# Patient Record
Sex: Female | Born: 1995 | Race: White | Hispanic: No | Smoking: Former smoker
Health system: Southern US, Community
[De-identification: ages and names within clinical notes are randomized; demographics above are authoritative.]

## PROBLEM LIST (undated history)

## (undated) DIAGNOSIS — L709 Acne, unspecified: Secondary | ICD-10-CM

## (undated) HISTORY — DX: Acne, unspecified: L70.9

---
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
## 2020-05-23 NOTE — L&D Delivery Note (Signed)
DELIVERY NOTE  Pt complete and at +2 station with urge to push. Epidural controlling pain only moderately. Pt pushed and delivered a viable female infant in LOA position; long nuchal cord with triple nuchal, able to be reduced at perineum. Body cord then noted, delivered through. Anterior and posterior shoulders spontaneously delivered with next two pushes; body easily followed next. Infant placed on mothers abdomen and bulb suction of mouth and nose performed. Cord was then clamped and cut by FOB. Cord blood obtained, 3VC. Baby had a vigorous spontaneous cry noted. Placenta then delivered at 0919 intact. Fundal massage performed and pitocin per protocol. Fundus firm. The following lacerations were noted: BL labial abrasions, perineal abrasion. Initially oozing but became hemostatic with pressure, no suture required. EBL 350cc.  Mother and baby stable. Counts correct   Infant time: 0915 Gender: female Placenta time: 0919 Apgars: 9/9 Weight: pending skin-to-skin

## 2020-09-23 ENCOUNTER — Encounter: Payer: Self-pay | Admitting: Family Medicine

## 2020-09-23 ENCOUNTER — Ambulatory Visit (INDEPENDENT_AMBULATORY_CARE_PROVIDER_SITE_OTHER): Payer: BC Managed Care – PPO | Admitting: Family Medicine

## 2020-09-23 ENCOUNTER — Other Ambulatory Visit: Payer: Self-pay

## 2020-09-23 ENCOUNTER — Other Ambulatory Visit (HOSPITAL_COMMUNITY)
Admission: RE | Admit: 2020-09-23 | Discharge: 2020-09-23 | Disposition: A | Discharge status: Home / Self Care | Payer: BC Managed Care – PPO | Source: Ambulatory Visit | Attending: Family Medicine | Admitting: Family Medicine

## 2020-09-23 VITALS — BP 124/77 | HR 69 | Wt 144.0 lb

## 2020-09-23 DIAGNOSIS — Z3401 Encounter for supervision of normal first pregnancy, first trimester: Secondary | ICD-10-CM | POA: Insufficient documentation

## 2020-09-23 DIAGNOSIS — Z3A01 Less than 8 weeks gestation of pregnancy: Secondary | ICD-10-CM | POA: Diagnosis not present

## 2020-09-23 NOTE — Progress Notes (Signed)
  Patient had pap smear at [facility] in Feb 2022.   DATING AND VIABILITY SONOGRAM   Stuti Sandin is a 25 y.o. year old G1P0 with LMP Patient's last menstrual period was 07/30/2020. which would correlate to  [redacted]w[redacted]d weeks gestation.  She has regular menstrual cycles.   She is here today for a confirmatory initial sonogram.    GESTATION: SINGLETON     FETAL ACTIVITY:          Heart rate         175          The fetus is active.       GESTATIONAL AGE AND  BIOMETRICS:  Gestational criteria: Estimated Date of Delivery: 05/12/21 by early ultrasound now at [redacted]w[redacted]d  Previous Scans:0  GESTATIONAL SAC           cm        6-6 weeks  CROWN RUMP LENGTH           0.90 cm         7-0 weeks         0.94 cm         7-0 weeks                                                                           AVERAGE EGA(BY THIS SCAN):  7-0 weeks  WORKING EDD( early ultrasound ):  05-12-2021     TECHNICIAN COMMENTS: Patient informed that the ultrasound is considered a limited obstetric ultrasound and is not intended to be a complete ultrasound exam. Patient also informed that the ultrasound is not being completed with the intent of assessing for fetal or placental anomalies or any pelvic abnormalities. Explained that the purpose of today's ultrasound is to assess for fetal heart rate. Patient acknowledges the purpose of the exam and the limitations of the study.     Armandina Stammer 09/23/2020 11:08 AM

## 2020-09-23 NOTE — Addendum Note (Signed)
Addended by: Anell Barr on: 09/23/2020 01:31 PM   Modules accepted: Orders

## 2020-09-23 NOTE — Addendum Note (Signed)
Addended by: Anell Barr on: 09/23/2020 01:36 PM   Modules accepted: Orders

## 2020-09-23 NOTE — Progress Notes (Addendum)
Subjective:  Stacey Anderson is a G1P0 [redacted]w[redacted]d by first trimester [place], being seen today for her first obstetrical visit.  Her obstetrical history is significant for first pregnancy. Patient does not intend to breast feed. Pregnancy history fully reviewed.  Patient reports nausea.  BP 124/77   Pulse 69   Wt 144 lb (65.3 kg)   LMP 07/30/2020   HISTORY: OB History  Gravida Para Term Preterm AB Living  1            SAB IAB Ectopic Multiple Live Births               # Outcome Date GA Lbr Len/2nd Weight Sex Delivery Anes PTL Lv  1 Current             Past Medical History:  Diagnosis Date  . Acne     History reviewed. No pertinent surgical history.  Family History  Problem Relation Age of Onset  . Cancer Mother   . Heart disease Mother   . Diabetes Neg Hx   . Hypertension Neg Hx   . Stroke Neg Hx      Exam  BP 124/77   Pulse 69   Wt 144 lb (65.3 kg)   LMP 07/30/2020   Chaperone present during exam  CONSTITUTIONAL: Well-developed, well-nourished female in no acute distress.  HENT:  Normocephalic, atraumatic, External right and left ear normal. Oropharynx is clear and moist EYES: Conjunctivae and EOM are normal. Pupils are equal, round, and reactive to light. No scleral icterus.  NECK: Normal range of motion, supple, no masses.  Normal thyroid.  CARDIOVASCULAR: Normal heart rate noted, regular rhythm RESPIRATORY: Clear to auscultation bilaterally. Effort and breath sounds normal, no problems with respiration noted. BREASTS: Symmetric in size. No masses, skin changes, nipple drainage, or lymphadenopathy. ABDOMEN: Soft, normal bowel sounds, no distention noted.  No tenderness, rebound or guarding.  PELVIC: Normal appearing external genitalia; normal appearing vaginal mucosa and cervix. No abnormal discharge noted. Normal uterine size, no other palpable masses, no uterine or adnexal tenderness. MUSCULOSKELETAL: Normal range of motion. No tenderness.  No cyanosis, clubbing, or  edema.  2+ distal pulses. SKIN: Skin is warm and dry. No rash noted. Not diaphoretic. No erythema. No pallor. NEUROLOGIC: Alert and oriented to person, place, and time. Normal reflexes, muscle tone coordination. No cranial nerve deficit noted. PSYCHIATRIC: Normal mood and affect. Normal behavior. Normal judgment and thought content.    Assessment:    Pregnancy: G1P0 Patient Active Problem List   Diagnosis Date Noted  . Encounter for supervision of normal first pregnancy in first trimester 09/23/2020  . Left hand pain 08/14/2012      Plan:   1. Encounter for supervision of normal first pregnancy in first trimester Initial labs obtained Continue prenatal vitamins Reviewed n/v relief measures and warning s/s to report Reviewed recommended weight gain based on pre-gravid BMI Encouraged well-balanced diet Genetic & carrier screening discussed: requests Panorama Ultrasound discussed; fetal survey: requested CCNC completed> form faxed if has or is planning to apply for medicaid The nature of [facility] [employer] with multiple MDs and other Advanced Practice Providers was explained to patient; also emphasized that fellows, residents, and students are part of our team. - CBC/D/Plt+RPR+Rh+ABO+Rub Ab... - Urine Culture - GC/Chlamydia probe amp ([place])not at [facility] - [place] MFM OB COMP + 14 WK; Future - Enroll Patient in PreNatal Babyscripts - Babyscripts Schedule Optimization   Problem list reviewed and updated. 75% of 30 min  visit spent on counseling and coordination of care.     Levie Heritage 09/23/2020

## 2020-09-24 LAB — GC/CHLAMYDIA PROBE AMP (~~LOC~~) NOT AT ARMC
Chlamydia: NEGATIVE
Comment: NEGATIVE
Comment: NORMAL
Neisseria Gonorrhea: NEGATIVE

## 2020-09-25 LAB — OBSTETRIC PANEL, INCLUDING HIV
Antibody Screen: NEGATIVE
Basophils Absolute: 0.1 10*3/uL (ref 0.0–0.2)
Basos: 1 %
EOS (ABSOLUTE): 0.5 10*3/uL — ABNORMAL HIGH (ref 0.0–0.4)
Eos: 6 %
HIV Screen 4th Generation wRfx: NONREACTIVE
Hematocrit: 37.2 % (ref 34.0–46.6)
Hemoglobin: 12.6 g/dL (ref 11.1–15.9)
Hepatitis B Surface Ag: NEGATIVE
Immature Grans (Abs): 0 10*3/uL (ref 0.0–0.1)
Immature Granulocytes: 0 %
Lymphocytes Absolute: 2.2 10*3/uL (ref 0.7–3.1)
Lymphs: 24 %
MCH: 31.3 pg (ref 26.6–33.0)
MCHC: 33.9 g/dL (ref 31.5–35.7)
MCV: 92 fL (ref 79–97)
Monocytes Absolute: 0.6 10*3/uL (ref 0.1–0.9)
Monocytes: 6 %
Neutrophils Absolute: 5.8 10*3/uL (ref 1.4–7.0)
Neutrophils: 63 %
Platelets: 253 10*3/uL (ref 150–450)
RBC: 4.03 x10E6/uL (ref 3.77–5.28)
RDW: 12.1 % (ref 11.7–15.4)
RPR Ser Ql: NONREACTIVE
Rh Factor: POSITIVE
Rubella Antibodies, IGG: 18.3 index (ref >0.99)
WBC: 9.2 10*3/uL (ref 3.4–10.8)

## 2020-09-25 LAB — HEPATITIS C ANTIBODY
HCV Ab: NEGATIVE
Hep C Virus Ab: 0.1 s/co ratio (ref 0.0–0.9)

## 2020-09-25 LAB — URINE CULTURE

## 2020-10-20 ENCOUNTER — Encounter: Payer: Self-pay | Admitting: Advanced Practice Midwife

## 2020-10-20 ENCOUNTER — Other Ambulatory Visit: Payer: Self-pay

## 2020-10-20 ENCOUNTER — Ambulatory Visit (INDEPENDENT_AMBULATORY_CARE_PROVIDER_SITE_OTHER): Payer: BC Managed Care – PPO | Admitting: Advanced Practice Midwife

## 2020-10-20 VITALS — BP 123/72 | HR 81 | Wt 149.0 lb

## 2020-10-20 DIAGNOSIS — Z3A10 10 weeks gestation of pregnancy: Secondary | ICD-10-CM

## 2020-10-20 NOTE — Progress Notes (Signed)
   PRENATAL VISIT NOTE  Subjective:  Stacey Anderson is a 25 y.o. G1P0 at [redacted]w[redacted]d being seen today for ongoing prenatal care.  She is currently monitored for the following issues for this low-risk pregnancy and has Left hand pain and Encounter for supervision of normal first pregnancy in first trimester on their problem list.  Patient reports no complaints.  Contractions: Not present. Vag. Bleeding: None.  Movement: Absent. Denies leaking of fluid.   The following portions of the patient's history were reviewed and updated as appropriate: allergies, current medications, past family history, past medical history, past social history, past surgical history and problem list.   Objective:   Vitals:   10/20/20 0933  BP: 123/72  Pulse: 81  Weight: 149 lb (67.6 kg)    Fetal Status: Fetal Heart Rate (bpm): 160   Movement: Absent     General:  Alert, oriented and cooperative. Patient is in no acute distress.  Skin: Skin is warm and dry. No rash noted.   Cardiovascular: Normal heart rate noted  Respiratory: Normal respiratory effort, no problems with respiration noted  Abdomen: Soft, gravid, appropriate for gestational age.  Pain/Pressure: Absent     Pelvic: Cervical exam deferred        Extremities: Normal range of motion.  Edema: None  Mental Status: Normal mood and affect. Normal behavior. Normal judgment and thought content.   Assessment and Plan:  Pregnancy: G1P0 at [redacted]w[redacted]d 1. [redacted] weeks gestation of pregnancy     Panorama done today, plan AFP next month     Anatomy US at 20 wks - Genetic Screening  Preterm labor symptoms and general obstetric precautions including but not limited to vaginal bleeding, contractions, leaking of fluid and fetal movement were reviewed in detail with the patient. Please refer to After Visit Summary for other counseling recommendations.   Return in about 5 weeks (around 11/24/2020), or AFP next visit.  Future Appointments  Date Time Provider Department Center   11/17/2020  8:15 AM Aviva Signs, CNM [facility] None  12/15/2020  8:15 AM Aviva Signs, CNM [facility] None  12/16/2020 10:45 AM [facility] US5 [facility]    Wynelle Bourgeois, CNM

## 2020-10-20 NOTE — Patient Instructions (Signed)
Obstetrics: Normal and Problem Pregnancies (7th ed., pp. 102-121). [place], [place]: Elsevier."> Textbook of Family Medicine (9th ed., pp. [phone]). [place], [place]: Elsevier [employer].">  First Trimester of Pregnancy  The first trimester of pregnancy starts on the first day of your last menstrual period until the end of week 12. This is months 1 through 3 of pregnancy. A week after a sperm fertilizes an egg, the egg will implant into the wall of the uterus and begin to develop into a baby. By the end of 12 weeks, all the baby's organs will be formed and the baby will be 2-3 inches in size. Body changes during your first trimester Your body goes through many changes during pregnancy. The changes vary and generally return to normal after your baby is born. Physical changes  You may gain or lose weight.  Your breasts may begin to grow larger and become tender. The tissue that surrounds your nipples (areola) may become darker.  Dark spots or blotches (chloasma or mask of pregnancy) may develop on your face.  You may have changes in your hair. These can include thickening or thinning of your hair or changes in texture. Health changes  You may feel nauseous, and you may vomit.  You may have heartburn.  You may develop headaches.  You may develop constipation.  Your gums may bleed and may be sensitive to brushing and flossing. Other changes  You may tire easily.  You may urinate more often.  Your menstrual periods will stop.  You may have a loss of appetite.  You may develop cravings for certain kinds of food.  You may have changes in your emotions from day to day.  You may have more vivid and strange dreams. Follow these instructions at home: Medicines  Follow your health care provider's instructions regarding medicine use. Specific medicines may be either safe or unsafe to take during pregnancy. Do not take any medicines unless told to by your health care provider.  Take a  prenatal vitamin that contains at least 600 micrograms (mcg) of folic acid. Eating and drinking  Eat a healthy diet that includes fresh fruits and vegetables, whole grains, good sources of protein such as meat, eggs, or tofu, and low-fat dairy products.  Avoid raw meat and unpasteurized juice, milk, and cheese. These carry germs that can harm you and your baby.  If you feel nauseous or you vomit: ? Eat 4 or 5 small meals a day instead of 3 large meals. ? Try eating a few soda crackers. ? Drink liquids between meals instead of during meals.  You may need to take these actions to prevent or treat constipation: ? Drink enough fluid to keep your urine pale yellow. ? Eat foods that are high in fiber, such as beans, whole grains, and fresh fruits and vegetables. ? Limit foods that are high in fat and processed sugars, such as fried or sweet foods. Activity  Exercise only as directed by your health care provider. Most people can continue their usual exercise routine during pregnancy. Try to exercise for 30 minutes at least 5 days a week.  Stop exercising if you develop pain or cramping in the lower abdomen or lower back.  Avoid exercising if it is very hot or humid or if you are at high altitude.  Avoid heavy lifting.  If you choose to, you may have sex unless your health care provider tells you not to. Relieving pain and discomfort  Wear a good support bra to relieve breast  tenderness.  Rest with your legs elevated if you have leg cramps or low back pain.  If you develop bulging veins (varicose veins) in your legs: ? Wear support hose as told by your health care provider. ? Elevate your feet for 15 minutes, 3-4 times a day. ? Limit salt in your diet. Safety  Wear your seat belt at all times when driving or riding in a car.  Talk with your health care provider if someone is verbally or physically abusive to you.  Talk with your health care provider if you are feeling sad or have  thoughts of hurting yourself. Lifestyle  Do not use hot tubs, steam rooms, or saunas.  Do not douche. Do not use tampons or scented sanitary pads.  Do not use herbal remedies, alcohol, illegal drugs, or medicines that are not approved by your health care provider. Chemicals in these products can harm your baby.  Do not use any products that contain nicotine or tobacco, such as cigarettes, e-cigarettes, and chewing tobacco. If you need help quitting, ask your health care provider.  Avoid cat litter boxes and soil used by cats. These carry germs that can cause birth defects in the baby and possibly loss of the unborn baby (fetus) by miscarriage or stillbirth. General instructions  During routine prenatal visits in the first trimester, your health care provider will do a physical exam, perform necessary tests, and ask you how things are going. Keep all follow-up visits. This is important.  Ask for help if you have counseling or nutritional needs during pregnancy. Your health care provider can offer advice or refer you to specialists for help with various needs.  Schedule a dentist appointment. At home, brush your teeth with a soft toothbrush. Floss gently.  Write down your questions. Take them to your prenatal visits. Where to find more information  American Pregnancy Association: americanpregnancy.org  [employer] of Obstetricians and Gynecologists: https://www.todd-brady.net/  Office on [employer] Health: MightyReward.co.nz Contact a health care provider if you have:  Dizziness.  A fever.  Mild pelvic cramps, pelvic pressure, or nagging pain in the abdominal area.  Nausea, vomiting, or diarrhea that lasts for 24 hours or longer.  A bad-smelling vaginal discharge.  Pain when you urinate.  Known exposure to a contagious illness, such as chickenpox, measles, Zika virus, HIV, or hepatitis. Get help right away if you have:  Spotting or bleeding from your  vagina.  Severe abdominal cramping or pain.  Shortness of breath or chest pain.  Any kind of trauma, such as from a fall or a car crash.  New or increased pain, swelling, or redness in an arm or leg. Summary  The first trimester of pregnancy starts on the first day of your last menstrual period until the end of week 12 (months 1 through 3).  Eating 4 or 5 small meals a day rather than 3 large meals may help to relieve nausea and vomiting.  Do not use any products that contain nicotine or tobacco, such as cigarettes, e-cigarettes, and chewing tobacco. If you need help quitting, ask your health care provider.  Keep all follow-up visits. This is important. This information is not intended to replace advice given to you by your health care provider. Make sure you discuss any questions you have with your health care provider. Document Revised: 10/16/2019 Document Reviewed: 08/22/2019 Elsevier Patient Education  2021 Elsevier Inc. Genetic Testing During Pregnancy Why is genetic testing done? Genetic testing during pregnancy is also called prenatal genetic testing.  This type of testing can determine if your baby is at risk of being born with a disorder caused by abnormal genes or chromosomes (genetic disorder). Chromosomes contain genes that control how your baby will develop in your womb. There are many different genetic disorders. Examples of genetic disorders that may be found through genetic testing include Down syndrome and cystic fibrosis. Gene changes (mutations) can be passed down through families. Genetic testing is offered to women during pregnancy. You can choose whether to have genetic testing. Having genetic testing allows you to:  Discuss your test results and options with your health care provider.  Prepare for a baby that may be born with a genetic disorder. Learning about the disorder ahead of time helps you be better prepared to manage it. Your health care providers can also  be prepared in case your baby requires special care before or after birth.  Consider whether you want to continue with the pregnancy. In some cases, genetic testing may be done to learn about the traits a child will inherit. Types of genetic tests There are two basic types of genetic testing. Screening tests indicate whether your developing baby (fetus) is at higher risk for a genetic disorder. Diagnostic tests check actual fetal cells to diagnose a genetic disorder. Screening tests Screening tests will not harm your baby. They are recommended for all pregnant women. Types of screening tests include:  Carrier screening. This test involves checking genes from both parents by testing their blood or saliva. The test checks to find out if the parents carry a genetic mutation that may be passed to a baby. In most cases, both parents must carry the mutation for a baby to be at risk.  First trimester screening. This test combines a blood test with sound wave imaging of your baby (fetal ultrasound). This screening test checks for a risk of Down syndrome or other defects caused by having extra chromosomes. The ultrasound also checks for defects of the heart, abdomen, or skeleton.  Second trimester screening also combines a blood test with a fetal ultrasound exam. This test checks for a risk of Down syndrome or other defects caused by having extra chromosomes. The ultrasound allows your health care provider to look for genetic defects of the face, brain, spine, heart, or limbs. Some women may choose to only have an ultrasound exam without a blood test.  Combined or sequential screening. This type of testing combines the results of first and second trimester screening. This type of testing may be more accurate than first or second trimester screening alone.  Cell-free DNA testing. This is a blood test that detects cells released by the placenta that get into the mother's blood. It can be used to check for a  risk of Down syndrome, other extra chromosome syndromes, and disorders caused by abnormal numbers of sex chromosomes. This test can be done any time after 10 weeks of pregnancy.      Diagnostic tests Diagnostic tests carry slight risks of problems, including bleeding, infection, and loss of the pregnancy. These tests are done only if your baby is at risk for a genetic disorder. Your health care provider will discuss the risks and benefits of having diagnostic tests before performing these types of tests. Examples of diagnostic tests include:  Chorionic villus sampling (CVS). This involves a procedure to remove and test a sample of cells taken from the placenta. The procedure may be done between 10 and 12 weeks of pregnancy.  Amniocentesis. This involves a  procedure to remove and test a sample of fluid (amniotic fluid) and cells from the sac that surrounds the developing baby. The procedure may be done any time during the pregnancy, but it is usually done between 15 and 20 weeks of pregnancy. What do the results mean? For a screening test:  If the results are negative, it often means that your child is not at higher risk. There is still a slight chance your child could have a genetic disorder.  If the results are positive, it does not mean your child will have a genetic disorder. It may mean that your child has a higher-than-normal risk for a genetic disorder. In that case, you should talk with your health care provider about whether you should have diagnostic genetic tests. For a diagnostic test:  If the result is negative, it is unlikely that your child will have a genetic disorder.  If the test is positive for a genetic disorder, it is likely that your child will have the disorder. The test may not tell how severe the disorder will be. Talk with your health care provider about your options. Talk with your health care provider about what your results mean. Questions to ask your health care  provider Before talking to your health care provider about genetic testing, find out if there is a history of genetic disorders in your family. It may also help to know your family's ethnic origins. Then ask your health care provider the following questions:  Is my baby at risk for a genetic disorder?  What are the benefits of having genetic screening?  What tests are best for me and my baby?  What are the risks of each test?  If I get a positive result on a screening test, what is the next step?  Should I meet with a genetic counselor?  Should my partner or other members of my family be tested?  How much do the tests cost? Will my insurance cover the testing? Summary  Genetic testing is done during pregnancy to find out whether your child is at risk for a genetic disorder.  Genetic testing is offered to women during pregnancy. You can choose whether to have genetic testing.  There are two basic types of genetic testing. Screening tests indicate whether your developing baby (fetus) is at higher risk for a genetic disorder. Diagnostic tests check actual fetal cells to diagnose a genetic disorder.  If a diagnostic genetic test is positive, talk with your health care provider about your options. This information is not intended to replace advice given to you by your health care provider. Make sure you discuss any questions you have with your health care provider. Document Revised: 11/29/2019 Document Reviewed: 11/29/2019 Elsevier Patient Education  2021 [employer].

## 2020-10-27 ENCOUNTER — Encounter: Payer: Self-pay | Admitting: General Practice

## 2020-11-06 ENCOUNTER — Encounter: Payer: Self-pay | Admitting: General Practice

## 2020-11-17 ENCOUNTER — Other Ambulatory Visit: Payer: Self-pay

## 2020-11-17 ENCOUNTER — Ambulatory Visit (INDEPENDENT_AMBULATORY_CARE_PROVIDER_SITE_OTHER): Payer: BC Managed Care – PPO | Admitting: Advanced Practice Midwife

## 2020-11-17 ENCOUNTER — Encounter: Payer: Self-pay | Admitting: Advanced Practice Midwife

## 2020-11-17 VITALS — BP 127/70 | HR 64 | Wt 151.0 lb

## 2020-11-17 DIAGNOSIS — Z3A14 14 weeks gestation of pregnancy: Secondary | ICD-10-CM

## 2020-11-17 DIAGNOSIS — Z3401 Encounter for supervision of normal first pregnancy, first trimester: Secondary | ICD-10-CM

## 2020-11-17 NOTE — Progress Notes (Signed)
   PRENATAL VISIT NOTE  Subjective:  Stacey Anderson is a 25 y.o. G1P0 at [redacted]w[redacted]d being seen today for ongoing prenatal care.  She is currently monitored for the following issues for this low-risk pregnancy and has Left hand pain and Encounter for supervision of normal first pregnancy in first trimester on their problem list.  Patient reports no complaints.  Contractions: Not present. Vag. Bleeding: None.  Movement: Absent. Denies leaking of fluid.   The following portions of the patient's history were reviewed and updated as appropriate: allergies, current medications, past family history, past medical history, past social history, past surgical history and problem list.   Objective:   Vitals:   11/17/20 0809  BP: 127/70  Pulse: 64  Weight: 151 lb (68.5 kg)    Fetal Status: Fetal Heart Rate (bpm): 152   Movement: Absent     General:  Alert, oriented and cooperative. Patient is in no acute distress.  Skin: Skin is warm and dry. No rash noted.   Cardiovascular: Normal heart rate noted  Respiratory: Normal respiratory effort, no problems with respiration noted  Abdomen: Soft, gravid, appropriate for gestational age.  Pain/Pressure: Absent     Pelvic: Cervical exam deferred        Extremities: Normal range of motion.  Edema: None  Mental Status: Normal mood and affect. Normal behavior. Normal judgment and thought content.   Assessment and Plan:  Pregnancy: G1P0 at 101w6d 1. [redacted] weeks gestation of pregnancy     Reviewed hospital location, use of MAU, staffing and students.   - AFP, Serum, Open Spina Bifida  2. Encounter for supervision of normal first pregnancy in first trimester     Panorama low risk female - AFP, Serum, Open Spina Bifida  Preterm labor symptoms and general obstetric precautions including but not limited to vaginal bleeding, contractions, leaking of fluid and fetal movement were reviewed in detail with the patient. Please refer to After Visit Summary for other  counseling recommendations.   Return in about 4 weeks (around 12/15/2020) for [facility].  Future Appointments  Date Time Provider Department Center  12/15/2020  8:15 AM Aviva Signs, CNM [facility] None  12/16/2020 10:45 AM [facility] US5 [facility]    Wynelle Bourgeois, CNM

## 2020-11-24 LAB — AFP, SERUM, OPEN SPINA BIFIDA
AFP MoM: 1.78
AFP Value: 53.8 ng/mL
Gest. Age on Collection Date: 15 weeks
Maternal Age At EDD: 22.9 yr
OSBR Risk 1 IN: 1341
Test Results:: NEGATIVE
Weight: 151 [lb_av]

## 2020-12-15 ENCOUNTER — Ambulatory Visit (INDEPENDENT_AMBULATORY_CARE_PROVIDER_SITE_OTHER): Payer: BC Managed Care – PPO | Admitting: Advanced Practice Midwife

## 2020-12-15 ENCOUNTER — Other Ambulatory Visit: Payer: Self-pay

## 2020-12-15 VITALS — BP 119/68 | HR 78 | Wt 157.0 lb

## 2020-12-15 DIAGNOSIS — Z3A18 18 weeks gestation of pregnancy: Secondary | ICD-10-CM

## 2020-12-15 NOTE — Progress Notes (Signed)
   PRENATAL VISIT NOTE  Subjective:  Stacey Anderson is a 25 y.o. G1P0 at [redacted]w[redacted]d being seen today for ongoing prenatal care.  She is currently monitored for the following issues for this low-risk pregnancy and has Encounter for supervision of normal first pregnancy in first trimester on their problem list.  Patient reports no complaints.  Contractions: Not present. Vag. Bleeding: None.  Movement: Absent. Denies leaking of fluid.   The following portions of the patient's history were reviewed and updated as appropriate: allergies, current medications, past family history, past medical history, past social history, past surgical history and problem list.   Objective:   Vitals:   12/15/20 0813  BP: 119/68  Pulse: 78  Weight: 157 lb (71.2 kg)    Fetal Status: Fetal Heart Rate (bpm): 150   Movement: Absent     General:  Alert, oriented and cooperative. Patient is in no acute distress.  Skin: Skin is warm and dry. No rash noted.   Cardiovascular: Normal heart rate noted  Respiratory: Normal respiratory effort, no problems with respiration noted  Abdomen: Soft, gravid, appropriate for gestational age.  Pain/Pressure: Absent     Pelvic: Cervical exam deferred        Extremities: Normal range of motion.  Edema: None  Mental Status: Normal mood and affect. Normal behavior. Normal judgment and thought content.   Assessment and Plan:  Pregnancy: G1P0 at [redacted]w[redacted]d 1. [redacted] weeks gestation of pregnancy Has [place] scheduled for tomorrow  Preterm labor symptoms and general obstetric precautions including but not limited to vaginal bleeding, contractions, leaking of fluid and fetal movement were reviewed in detail with the patient. Please refer to After Visit Summary for other counseling recommendations.     Future Appointments  Date Time Provider Department Center  12/16/2020 10:45 AM [facility]5 [facility]  01/12/2021 10:55 AM Aviva Signs, CNM [facility] None  02/09/2021  8:15 AM Aviva Signs,  CNM [facility] None    Wynelle Bourgeois, CNM

## 2020-12-16 ENCOUNTER — Ambulatory Visit: Payer: BC Managed Care – PPO | Attending: Obstetrics and Gynecology

## 2020-12-16 DIAGNOSIS — Z3401 Encounter for supervision of normal first pregnancy, first trimester: Secondary | ICD-10-CM | POA: Diagnosis present

## 2021-01-12 ENCOUNTER — Ambulatory Visit (INDEPENDENT_AMBULATORY_CARE_PROVIDER_SITE_OTHER): Payer: BC Managed Care – PPO | Admitting: Advanced Practice Midwife

## 2021-01-12 ENCOUNTER — Other Ambulatory Visit: Payer: Self-pay

## 2021-01-12 ENCOUNTER — Encounter: Payer: Self-pay | Admitting: Advanced Practice Midwife

## 2021-01-12 VITALS — BP 137/81 | HR 100 | Wt 162.0 lb

## 2021-01-12 DIAGNOSIS — Z3A22 22 weeks gestation of pregnancy: Secondary | ICD-10-CM

## 2021-01-12 DIAGNOSIS — M25531 Pain in right wrist: Secondary | ICD-10-CM

## 2021-01-12 DIAGNOSIS — M5430 Sciatica, unspecified side: Secondary | ICD-10-CM

## 2021-01-12 DIAGNOSIS — M25532 Pain in left wrist: Secondary | ICD-10-CM

## 2021-01-12 NOTE — Progress Notes (Signed)
   PRENATAL VISIT NOTE  Subjective:  Stacey Anderson is a 25 y.o. G1P0 at [redacted]w[redacted]d being seen today for ongoing prenatal care.  She is currently monitored for the following issues for this low-risk pregnancy and has Encounter for supervision of normal first pregnancy in first trimester; Bilateral wrist pain; and Sciatic leg pain on their problem list.  Patient reports  bolateral wrist/hand pain, right sciatic pain, and feet swelling.   Also requesting temp handicapped sticker for car for college so she won't have to walk so far.   .  Contractions: Not present. Vag. Bleeding: None.  Movement: Present. Denies leaking of fluid.   Transferring to [facility] so she  can have a primary doctor  Her mother went there for care  The following portions of the patient's history were reviewed and updated as appropriate: allergies, current medications, past family history, past medical history, past social history, past surgical history and problem list.   Objective:   Vitals:   01/12/21 1057  BP: 137/81  Pulse: 100  Weight: 162 lb (73.5 kg)    Fetal Status:     Movement: Present     General:  Alert, oriented and cooperative. Patient is in no acute distress.  Skin: Skin is warm and dry. No rash noted.   Cardiovascular: Normal heart rate noted  Respiratory: Normal respiratory effort, no problems with respiration noted  Abdomen: Soft, gravid, appropriate for gestational age.  Pain/Pressure: Absent     Pelvic: Cervical exam deferred        Extremities: Normal range of motion.  Edema: None  Mental Status: Normal mood and affect. Normal behavior. Normal judgment and thought content.   Assessment and Plan:  Pregnancy: G1P0 at [redacted]w[redacted]d 1. [redacted] weeks gestation of pregnancy     Glucola next month  2. Bilateral wrist pain     Advised bilateral wrist splints.   If unhelpful, refer to ortho/hand  3. Sciatic leg pain      Recommend start stretches.  If unhelpful, refer to PT  4.    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 Please refer to After Visit Summary for other counseling recommendations.   No follow-ups on file.  No future appointments.  Wynelle Bourgeois, CNM

## 2021-02-08 ENCOUNTER — Encounter: Payer: Self-pay | Admitting: General Practice

## 2021-02-09 ENCOUNTER — Encounter: Payer: BC Managed Care – PPO | Admitting: Advanced Practice Midwife

## 2021-02-10 LAB — OB RESULTS CONSOLE GC/CHLAMYDIA
Chlamydia: NEGATIVE
Gonorrhea: NEGATIVE

## 2021-04-22 LAB — OB RESULTS CONSOLE GBS: GBS: NEGATIVE

## 2021-04-28 ENCOUNTER — Encounter (HOSPITAL_COMMUNITY): Payer: Self-pay | Admitting: Obstetrics and Gynecology

## 2021-04-28 ENCOUNTER — Inpatient Hospital Stay (HOSPITAL_COMMUNITY)
Admission: AD | Admit: 2021-04-28 | Discharge: 2021-04-30 | DRG: 807 | Disposition: A | Discharge status: Home / Self Care | Payer: BC Managed Care – PPO | Attending: Obstetrics and Gynecology | Admitting: Obstetrics and Gynecology

## 2021-04-28 ENCOUNTER — Other Ambulatory Visit: Payer: Self-pay

## 2021-04-28 DIAGNOSIS — Z87891 Personal history of nicotine dependence: Secondary | ICD-10-CM

## 2021-04-28 DIAGNOSIS — Z20822 Contact with and (suspected) exposure to covid-19: Secondary | ICD-10-CM | POA: Diagnosis present

## 2021-04-28 DIAGNOSIS — O133 Gestational [pregnancy-induced] hypertension without significant proteinuria, third trimester: Secondary | ICD-10-CM

## 2021-04-28 DIAGNOSIS — O139 Gestational [pregnancy-induced] hypertension without significant proteinuria, unspecified trimester: Secondary | ICD-10-CM | POA: Diagnosis present

## 2021-04-28 DIAGNOSIS — O134 Gestational [pregnancy-induced] hypertension without significant proteinuria, complicating childbirth: Secondary | ICD-10-CM | POA: Diagnosis present

## 2021-04-28 DIAGNOSIS — Z3A38 38 weeks gestation of pregnancy: Secondary | ICD-10-CM | POA: Diagnosis not present

## 2021-04-28 LAB — URINALYSIS, MICROSCOPIC (REFLEX)

## 2021-04-28 LAB — COMPREHENSIVE METABOLIC PANEL
ALT: 11 U/L (ref 0–44)
AST: 16 U/L (ref 15–41)
Albumin: 2.6 g/dL — ABNORMAL LOW (ref 3.5–5.0)
Alkaline Phosphatase: 158 U/L — ABNORMAL HIGH (ref 38–126)
Anion gap: 12 (ref 5–15)
BUN: <5 mg/dL — ABNORMAL LOW (ref 6–20)
CO2: 19 mmol/L — ABNORMAL LOW (ref 22–32)
Calcium: 9.2 mg/dL (ref 8.9–10.3)
Chloride: 106 mmol/L (ref 98–111)
Creatinine, Ser: 0.59 mg/dL (ref 0.44–1.00)
GFR, Estimated: >60 mL/min (ref >60)
Glucose, Bld: 86 mg/dL (ref 70–99)
Potassium: 3.8 mmol/L (ref 3.5–5.1)
Sodium: 137 mmol/L (ref 135–145)
Total Bilirubin: 0.4 mg/dL (ref 0.3–1.2)
Total Protein: 5.6 g/dL — ABNORMAL LOW (ref 6.5–8.1)

## 2021-04-28 LAB — CBC
HCT: 33.3 % — ABNORMAL LOW (ref 36.0–46.0)
Hemoglobin: 11 g/dL — ABNORMAL LOW (ref 12.0–15.0)
MCH: 28.8 pg (ref 26.0–34.0)
MCHC: 33 g/dL (ref 30.0–36.0)
MCV: 87.2 fL (ref 80.0–100.0)
Platelets: 246 10*3/uL (ref 150–400)
RBC: 3.82 MIL/uL — ABNORMAL LOW (ref 3.87–5.11)
RDW: 14 % (ref 11.5–15.5)
WBC: 11.4 10*3/uL — ABNORMAL HIGH (ref 4.0–10.5)
nRBC: 0 % (ref 0.0–0.2)

## 2021-04-28 LAB — RESP PANEL BY RT-PCR (FLU A&B, COVID) ARPGX2
Influenza A by PCR: NEGATIVE
Influenza B by PCR: NEGATIVE
SARS Coronavirus 2 by RT PCR: NEGATIVE

## 2021-04-28 LAB — URINALYSIS, ROUTINE W REFLEX MICROSCOPIC
Bilirubin Urine: NEGATIVE
Glucose, UA: NEGATIVE mg/dL
Ketones, ur: NEGATIVE mg/dL
Nitrite: NEGATIVE
Protein, ur: NEGATIVE mg/dL
Specific Gravity, Urine: 1.01 (ref 1.005–1.030)
pH: 6 (ref 5.0–8.0)

## 2021-04-28 LAB — PROTEIN / CREATININE RATIO, URINE
Creatinine, Urine: 40.51 mg/dL
Protein Creatinine Ratio: 0.17 mg/mg{Cre} — ABNORMAL HIGH (ref 0.00–0.15)
Total Protein, Urine: 7 mg/dL

## 2021-04-28 LAB — TYPE AND SCREEN
ABO/RH(D): A POS
Antibody Screen: NEGATIVE

## 2021-04-28 MED ORDER — ONDANSETRON HCL 4 MG/2ML IJ SOLN
4.0000 mg | Freq: Four times a day (QID) | INTRAMUSCULAR | Status: DC | PRN
  Administered 2021-04-29: 4 mg via INTRAVENOUS

## 2021-04-28 MED ORDER — BUTORPHANOL TARTRATE 1 MG/ML IJ SOLN
1.0000 mg | INTRAMUSCULAR | Status: DC | PRN
  Administered 2021-04-29: 1 mg via INTRAVENOUS
  Filled 2021-04-28: qty 1

## 2021-04-28 MED ORDER — OXYCODONE-ACETAMINOPHEN 5-325 MG PO TABS: 1.0000 | ORAL_TABLET | ORAL | Status: DC | PRN

## 2021-04-28 MED ORDER — OXYTOCIN-SODIUM CHLORIDE 30-0.9 UT/500ML-% IV SOLN
2.5000 [IU]/h | INTRAVENOUS | Status: DC
  Administered 2021-04-29: 2.5 [IU]/h via INTRAVENOUS

## 2021-04-28 MED ORDER — LIDOCAINE HCL (PF) 1 % IJ SOLN: 30.0000 mL | INTRAMUSCULAR | Status: DC | PRN

## 2021-04-28 MED ORDER — LACTATED RINGERS IV SOLN: 500.0000 mL | INTRAVENOUS | Status: DC | PRN

## 2021-04-28 MED ORDER — LACTATED RINGERS IV SOLN: INTRAVENOUS | Status: DC

## 2021-04-28 MED ORDER — OXYCODONE-ACETAMINOPHEN 5-325 MG PO TABS: 2.0000 | ORAL_TABLET | ORAL | Status: DC | PRN

## 2021-04-28 MED ORDER — ACETAMINOPHEN 325 MG PO TABS: 650.0000 mg | ORAL_TABLET | ORAL | Status: DC | PRN

## 2021-04-28 MED ORDER — FLEET ENEMA 7-19 GM/118ML RE ENEM: 1.0000 | ENEMA | RECTAL | Status: DC | PRN

## 2021-04-28 MED ORDER — OXYTOCIN BOLUS FROM INFUSION
333.0000 mL | Freq: Once | INTRAVENOUS | Status: AC
  Administered 2021-04-29: 333 mL via INTRAVENOUS

## 2021-04-28 MED ORDER — SOD CITRATE-CITRIC ACID 500-334 MG/5ML PO SOLN: 30.0000 mL | ORAL | Status: DC | PRN

## 2021-04-28 MED ORDER — OXYTOCIN-SODIUM CHLORIDE 30-0.9 UT/500ML-% IV SOLN
1.0000 m[IU]/min | INTRAVENOUS | Status: DC
  Administered 2021-04-28: 2 m[IU]/min via INTRAVENOUS
  Filled 2021-04-28: qty 500

## 2021-04-28 MED ORDER — TERBUTALINE SULFATE 1 MG/ML IJ SOLN: 0.2500 mg | Freq: Once | INTRAMUSCULAR | Status: DC | PRN

## 2021-04-28 NOTE — MAU Provider Note (Signed)
History     CSN: [id number]  Arrival date and time: 04/28/21 1523   Event Date/Time   First Provider Initiated Contact with Patient 04/28/21 1558      Chief Complaint  Patient presents with   Hypertension   HPI Stacey Anderson is a 25 y.o. G1P0 at [redacted]w[redacted]d who presents from the office for evaluation of elevated blood pressures. She reports at her appointment at 1400 she had 2 elevated BPs, 140/100 and 150/92. She denies any HA, visual changes or epigastric pain. She denies any hx of hypertension in the pregnancy. She denies any leaking or bleeding. Reports normal fetal movement.   OB History     Gravida  1   Para      Term      Preterm      AB      Living         SAB      IAB      Ectopic      Multiple      Live Births              Past Medical History:  Diagnosis Date   Acne     History reviewed. No pertinent surgical history.  Family History  Problem Relation Age of Onset   Cancer Mother    Heart disease Mother    Diabetes Neg Hx    Hypertension Neg Hx    Stroke Neg Hx     Social History   Tobacco Use   Smoking status: Former    Types: Cigarettes   Smokeless tobacco: Never  Vaping Use   Vaping Use: Former   Quit date: 09/09/2020  Substance Use Topics   Alcohol use: Yes   Drug use: No    Allergies: No Known Allergies  Medications Prior to Admission  Medication Sig Dispense Refill Last Dose   Prenatal Vit-Fe Fumarate-FA (MULTIVITAMIN-PRENATAL) 27-0.8 MG TABS tablet Take 1 tablet by mouth daily at 12 noon.   Past Week    Review of Systems  Constitutional: Negative.  Negative for fatigue and fever.  HENT: Negative.    Respiratory: Negative.  Negative for shortness of breath.   Cardiovascular: Negative.  Negative for chest pain.  Gastrointestinal: Negative.  Negative for abdominal pain, constipation, diarrhea, nausea and vomiting.  Genitourinary: Negative.  Negative for dysuria, vaginal bleeding and vaginal discharge.  Neurological:  Negative.  Negative for dizziness and headaches.  Physical Exam   Blood pressure (!) 146/94, pulse 96, temperature 98.8 F (37.1 C), temperature source Oral, resp. rate 15, weight 89.9 kg, last menstrual period 07/30/2020.  Patient Vitals for the past 24 hrs:  BP Temp Temp src Pulse Resp SpO2 Weight  04/28/21 1801 (!) 121/91 -- -- 91 -- -- --  04/28/21 1750 129/81 -- -- 95 -- 100 % --  04/28/21 1731 126/85 -- -- 87 -- -- --  04/28/21 1716 (!) 130/92 -- -- 95 -- -- --  04/28/21 1701 (!) 136/95 -- -- 98 -- -- --  04/28/21 1646 (!) 126/91 -- -- 93 -- -- --  04/28/21 1631 129/88 -- -- 89 -- -- --  04/28/21 1616 (!) 136/97 -- -- 100 -- -- --  04/28/21 1604 (!) 135/91 -- -- 90 -- -- --  04/28/21 1549 (!) 146/94 98.8 F (37.1 C) Oral 96 15 -- --  04/28/21 1531 -- -- -- -- -- -- 89.9 kg   Physical Exam Vitals and nursing note reviewed.  Constitutional:  General: She is not in acute distress.    Appearance: She is well-developed.  HENT:     Head: Normocephalic.  Eyes:     Pupils: Pupils are equal, round, and reactive to light.  Cardiovascular:     Rate and Rhythm: Normal rate and regular rhythm.     Heart sounds: Normal heart sounds.  Pulmonary:     Effort: Pulmonary effort is normal. No respiratory distress.     Breath sounds: Normal breath sounds.  Abdominal:     General: Bowel sounds are normal. There is no distension.     Palpations: Abdomen is soft.     Tenderness: There is no abdominal tenderness.  Skin:    General: Skin is warm and dry.  Neurological:     Mental Status: She is alert and oriented to person, place, and time.     Motor: No abnormal muscle tone.     Coordination: Coordination normal.     Deep Tendon Reflexes: Reflexes are normal and symmetric. Reflexes normal.  Psychiatric:        Behavior: Behavior normal.        Thought Content: Thought content normal.        Judgment: Judgment normal.    MAU Course  Procedures Results for orders placed or  performed during the hospital encounter of 04/28/21 (from the past 24 hour(s))  CBC     Status: Abnormal   Collection Time: 04/28/21  3:43 PM  Result Value Ref Range   WBC 11.4 (H) 4.0 - 10.5 K/uL   RBC 3.82 (L) 3.87 - 5.11 MIL/uL   Hemoglobin 11.0 (L) 12.0 - 15.0 g/dL   HCT 95.6 (L) 38.7 - 56.4 %   MCV 87.2 80.0 - 100.0 fL   MCH 28.8 26.0 - 34.0 pg   MCHC 33.0 30.0 - 36.0 g/dL   RDW 33.2 95.1 - 88.4 %   Platelets 246 150 - 400 K/uL   nRBC 0.0 0.0 - 0.2 %  Comprehensive metabolic panel     Status: Abnormal   Collection Time: 04/28/21  3:43 PM  Result Value Ref Range   Sodium 137 135 - 145 mmol/L   Potassium 3.8 3.5 - 5.1 mmol/L   Chloride 106 98 - 111 mmol/L   CO2 19 (L) 22 - 32 mmol/L   Glucose, Bld 86 70 - 99 mg/dL   BUN <5 (L) 6 - 20 mg/dL   Creatinine, Ser 1.66 0.44 - 1.00 mg/dL   Calcium 9.2 8.9 - 06.3 mg/dL   Total Protein 5.6 (L) 6.5 - 8.1 g/dL   Albumin 2.6 (L) 3.5 - 5.0 g/dL   AST 16 15 - 41 U/L   ALT 11 0 - 44 U/L   Alkaline Phosphatase 158 (H) 38 - 126 U/L   Total Bilirubin 0.4 0.3 - 1.2 mg/dL   GFR, Estimated >01 >60 mL/min   Anion gap 12 5 - 15  Urinalysis, Routine w reflex microscopic Urine, Clean Catch     Status: Abnormal   Collection Time: 04/28/21  3:44 PM  Result Value Ref Range   Color, Urine YELLOW YELLOW   APPearance CLEAR CLEAR   Specific Gravity, Urine 1.010 1.005 - 1.030   pH 6.0 5.0 - 8.0   Glucose, UA NEGATIVE NEGATIVE mg/dL   Hgb urine dipstick TRACE (A) NEGATIVE   Bilirubin Urine NEGATIVE NEGATIVE   Ketones, ur NEGATIVE NEGATIVE mg/dL   Protein, ur NEGATIVE NEGATIVE mg/dL   Nitrite NEGATIVE NEGATIVE   Leukocytes,Ua SMALL (A) NEGATIVE  Protein / creatinine ratio, urine     Status: Abnormal   Collection Time: 04/28/21  3:44 PM  Result Value Ref Range   Creatinine, Urine 40.51 mg/dL   Total Protein, Urine 7 mg/dL   Protein Creatinine Ratio 0.17 (H) 0.00 - 0.15 mg/mg[Cre]  Urinalysis, Microscopic (reflex)     Status: Abnormal   Collection  Time: 04/28/21  3:44 PM  Result Value Ref Range   RBC / HPF 0-5 0 - 5 RBC/hpf   WBC, UA 6-10 0 - 5 WBC/hpf   Bacteria, UA RARE (A) NONE SEEN   Squamous Epithelial / LPF 0-5 0 - 5   Mucus PRESENT     MDM Prenatal records from private office reviewed. Pregnancy uncomplicated. Labs ordered and reviewed.  UA CBC, CMP, Protein/creat ratio  Dr. Henderson Cloud notified that patient now meets criteria for gHTN and recommended admission. Requested CNM put in admission orders due to inability to at this time.    Assessment and Plan  -Gestational Hypertension -[redacted] weeks gestation  -Admit to labor and delivery -Care turned over to MD  Rolm Bookbinder CNM 04/28/2021, 3:58 PM

## 2021-04-28 NOTE — MAU Note (Signed)
.  Stacey Anderson is a 25 y.o. at [redacted]w[redacted]d here in MAU reporting: HBP in the office. Denies PIH s/sx. Denies VB or LOF. Endorses good fetal movement.   Pain score: 0 Lab orders placed from triage:  UA

## 2021-04-28 NOTE — H&P (Signed)
Please refer to CNM note for full details.  Briefly this is a 25 y.o. G1P0 [redacted]w[redacted]d who presented for routine visit today and was found to have BP of 140s/90s and was sent to MAU for further eval.  She has no preeclampsia symptoms.  Labs are normal.  She has had additional BPs over 140s/90s and qualifies for gestational hypertension and delivery.  Vitals:   04/28/21 1731 04/28/21 1750 04/28/21 1801 04/28/21 1907  BP: 126/85 129/81 (!) 121/91 134/88  Pulse: 87 95 91 94  Resp:      Temp:      TempSrc:      SpO2:  100%    Weight:         Prenatal Transfer Tool  Maternal Diabetes: No Genetic Screening: Normal Maternal Ultrasounds/Referrals: Normal Fetal Ultrasounds or other Referrals:  None Maternal Substance Abuse:  No Significant Maternal Medications:  None Significant Maternal Lab Results: Group B Strep negative  FHTs 120s, gSTV, NST R Toco q3-4 SVE 3/70/-2 in office.  Loney Laurence   CNM Note: HPI Stacey Anderson is a 25 y.o. G1P0 at [redacted]w[redacted]d who presents from the office for evaluation of elevated blood pressures. She reports at her appointment at 1400 she had 2 elevated BPs, 140/100 and 150/92. She denies any HA, visual changes or epigastric pain. She denies any hx of hypertension in the pregnancy. She denies any leaking or bleeding. Reports normal fetal movement.    OB History       Gravida  1   Para      Term      Preterm      AB      Living           SAB      IAB      Ectopic      Multiple      Live Births                      Past Medical History:  Diagnosis Date   Acne        History reviewed. No pertinent surgical history.        Family History  Problem Relation Age of Onset   Cancer Mother     Heart disease Mother     Diabetes Neg Hx     Hypertension Neg Hx     Stroke Neg Hx        Social History         Tobacco Use   Smoking status: Former      Types: Cigarettes   Smokeless tobacco: Never  Vaping Use   Vaping Use: Former    Quit date: 09/09/2020  Substance Use Topics   Alcohol use: Yes   Drug use: No      Allergies: No Known Allergies          Medications Prior to Admission  Medication Sig Dispense Refill Last Dose   Prenatal Vit-Fe Fumarate-FA (MULTIVITAMIN-PRENATAL) 27-0.8 MG TABS tablet Take 1 tablet by mouth daily at 12 noon.     Past Week      Review of Systems  Constitutional: Negative.  Negative for fatigue and fever.  HENT: Negative.    Respiratory: Negative.  Negative for shortness of breath.   Cardiovascular: Negative.  Negative for chest pain.  Gastrointestinal: Negative.  Negative for abdominal pain, constipation, diarrhea, nausea and vomiting.  Genitourinary: Negative.  Negative for dysuria, vaginal bleeding and vaginal discharge.  Neurological: Negative.  Negative  for dizziness and headaches.  Physical Exam    Blood pressure (!) 146/94, pulse 96, temperature 98.8 F (37.1 C), temperature source Oral, resp. rate 15, weight 89.9 kg, last menstrual period 07/30/2020.   Patient Vitals for the past 24 hrs:   BP Temp Temp src Pulse Resp SpO2 Weight  04/28/21 1801 (!) 121/91 -- -- 91 -- -- --  04/28/21 1750 129/81 -- -- 95 -- 100 % --  04/28/21 1731 126/85 -- -- 87 -- -- --  04/28/21 1716 (!) 130/92 -- -- 95 -- -- --  04/28/21 1701 (!) 136/95 -- -- 98 -- -- --  04/28/21 1646 (!) 126/91 -- -- 93 -- -- --  04/28/21 1631 129/88 -- -- 89 -- -- --  04/28/21 1616 (!) 136/97 -- -- 100 -- -- --  04/28/21 1604 (!) 135/91 -- -- 90 -- -- --  04/28/21 1549 (!) 146/94 98.8 F (37.1 C) Oral 96 15 -- --  04/28/21 1531 -- -- -- -- -- -- 89.9 kg    Physical Exam Vitals and nursing note reviewed.  Constitutional:      General: She is not in acute distress.    Appearance: She is well-developed.  HENT:     Head: Normocephalic.  Eyes:     Pupils: Pupils are equal, round, and reactive to light.  Cardiovascular:     Rate and Rhythm: Normal rate and regular rhythm.     Heart sounds: Normal heart  sounds.  Pulmonary:     Effort: Pulmonary effort is normal. No respiratory distress.     Breath sounds: Normal breath sounds.  Abdominal:     General: Bowel sounds are normal. There is no distension.     Palpations: Abdomen is soft.     Tenderness: There is no abdominal tenderness.  Skin:    General: Skin is warm and dry.  Neurological:     Mental Status: She is alert and oriented to person, place, and time.     Motor: No abnormal muscle tone.     Coordination: Coordination normal.     Deep Tendon Reflexes: Reflexes are normal and symmetric. Reflexes normal.  Psychiatric:        Behavior: Behavior normal.        Thought Content: Thought content normal.        Judgment: Judgment normal.      MAU Course  Procedures Lab Results Last 24 Hours       Results for orders placed or performed during the hospital encounter of 04/28/21 (from the past 24 hour(s))  CBC     Status: Abnormal    Collection Time: 04/28/21  3:43 PM  Result Value Ref Range    WBC 11.4 (H) 4.0 - 10.5 K/uL    RBC 3.82 (L) 3.87 - 5.11 MIL/uL    Hemoglobin 11.0 (L) 12.0 - 15.0 g/dL    HCT 18.8 (L) 41.6 - 46.0 %    MCV 87.2 80.0 - 100.0 fL    MCH 28.8 26.0 - 34.0 pg    MCHC 33.0 30.0 - 36.0 g/dL    RDW 60.6 30.1 - 60.1 %    Platelets 246 150 - 400 K/uL    nRBC 0.0 0.0 - 0.2 %  Comprehensive metabolic panel     Status: Abnormal    Collection Time: 04/28/21  3:43 PM  Result Value Ref Range    Sodium 137 135 - 145 mmol/L    Potassium 3.8 3.5 - 5.1 mmol/L  Chloride 106 98 - 111 mmol/L    CO2 19 (L) 22 - 32 mmol/L    Glucose, Bld 86 70 - 99 mg/dL    BUN <5 (L) 6 - 20 mg/dL    Creatinine, Ser 6.01 0.44 - 1.00 mg/dL    Calcium 9.2 8.9 - 09.3 mg/dL    Total Protein 5.6 (L) 6.5 - 8.1 g/dL    Albumin 2.6 (L) 3.5 - 5.0 g/dL    AST 16 15 - 41 U/L    ALT 11 0 - 44 U/L    Alkaline Phosphatase 158 (H) 38 - 126 U/L    Total Bilirubin 0.4 0.3 - 1.2 mg/dL    GFR, Estimated >23 >55 mL/min    Anion gap 12 5 - 15   Urinalysis, Routine w reflex microscopic Urine, Clean Catch     Status: Abnormal    Collection Time: 04/28/21  3:44 PM  Result Value Ref Range    Color, Urine YELLOW YELLOW    APPearance CLEAR CLEAR    Specific Gravity, Urine 1.010 1.005 - 1.030    pH 6.0 5.0 - 8.0    Glucose, UA NEGATIVE NEGATIVE mg/dL    Hgb urine dipstick TRACE (A) NEGATIVE    Bilirubin Urine NEGATIVE NEGATIVE    Ketones, ur NEGATIVE NEGATIVE mg/dL    Protein, ur NEGATIVE NEGATIVE mg/dL    Nitrite NEGATIVE NEGATIVE    Leukocytes,Ua SMALL (A) NEGATIVE  Protein / creatinine ratio, urine     Status: Abnormal    Collection Time: 04/28/21  3:44 PM  Result Value Ref Range    Creatinine, Urine 40.51 mg/dL    Total Protein, Urine 7 mg/dL    Protein Creatinine Ratio 0.17 (H) 0.00 - 0.15 mg/mg[Cre]  Urinalysis, Microscopic (reflex)     Status: Abnormal    Collection Time: 04/28/21  3:44 PM  Result Value Ref Range    RBC / HPF 0-5 0 - 5 RBC/hpf    WBC, UA 6-10 0 - 5 WBC/hpf    Bacteria, UA RARE (A) NONE SEEN    Squamous Epithelial / LPF 0-5 0 - 5    Mucus PRESENT        MDM Prenatal records from private office reviewed. Pregnancy uncomplicated. Labs ordered and reviewed.   UA CBC, CMP, Protein/creat ratio   Dr. Henderson Cloud notified that patient now meets criteria for gHTN and recommended admission. Requested CNM put in admission orders due to inability to at this time.     Assessment and Plan  -Gestational Hypertension -[redacted] weeks gestation   -Admit to labor and delivery -Care turned over to MD   Rolm Bookbinder CNM 04/28/2021, 3:58 PM

## 2021-04-29 ENCOUNTER — Inpatient Hospital Stay (HOSPITAL_COMMUNITY): Payer: BC Managed Care – PPO | Admitting: Anesthesiology

## 2021-04-29 ENCOUNTER — Encounter (HOSPITAL_COMMUNITY): Payer: Self-pay | Admitting: Obstetrics and Gynecology

## 2021-04-29 LAB — CBC WITH DIFFERENTIAL/PLATELET
Abs Immature Granulocytes: 0.13 10*3/uL — ABNORMAL HIGH (ref 0.00–0.07)
Basophils Absolute: 0 10*3/uL (ref 0.0–0.1)
Basophils Relative: 0 %
Eosinophils Absolute: 0.4 10*3/uL (ref 0.0–0.5)
Eosinophils Relative: 3 %
HCT: 31.9 % — ABNORMAL LOW (ref 36.0–46.0)
Hemoglobin: 10.5 g/dL — ABNORMAL LOW (ref 12.0–15.0)
Immature Granulocytes: 1 %
Lymphocytes Relative: 19 %
Lymphs Abs: 2.5 10*3/uL (ref 0.7–4.0)
MCH: 28.8 pg (ref 26.0–34.0)
MCHC: 32.9 g/dL (ref 30.0–36.0)
MCV: 87.6 fL (ref 80.0–100.0)
Monocytes Absolute: 1.1 10*3/uL — ABNORMAL HIGH (ref 0.1–1.0)
Monocytes Relative: 8 %
Neutro Abs: 9.1 10*3/uL — ABNORMAL HIGH (ref 1.7–7.7)
Neutrophils Relative %: 69 %
Platelets: 218 10*3/uL (ref 150–400)
RBC: 3.64 MIL/uL — ABNORMAL LOW (ref 3.87–5.11)
RDW: 14 % (ref 11.5–15.5)
WBC: 13.2 10*3/uL — ABNORMAL HIGH (ref 4.0–10.5)
nRBC: 0 % (ref 0.0–0.2)

## 2021-04-29 LAB — RPR: RPR Ser Ql: NONREACTIVE

## 2021-04-29 MED ORDER — ONDANSETRON HCL 4 MG/2ML IJ SOLN: 4.0000 mg | INTRAMUSCULAR | Status: DC | PRN

## 2021-04-29 MED ORDER — OXYCODONE HCL 5 MG PO TABS: 10.0000 mg | ORAL_TABLET | ORAL | Status: DC | PRN

## 2021-04-29 MED ORDER — BENZOCAINE-MENTHOL 20-0.5 % EX AERO
1.0000 "application " | INHALATION_SPRAY | CUTANEOUS | Status: DC | PRN
  Filled 2021-04-29 (×2): qty 56

## 2021-04-29 MED ORDER — ACETAMINOPHEN 325 MG PO TABS: 650.0000 mg | ORAL_TABLET | ORAL | Status: DC | PRN

## 2021-04-29 MED ORDER — EPHEDRINE 5 MG/ML INJ: 10.0000 mg | INTRAVENOUS | Status: DC | PRN

## 2021-04-29 MED ORDER — ZOLPIDEM TARTRATE 5 MG PO TABS: 5.0000 mg | ORAL_TABLET | Freq: Every evening | ORAL | Status: DC | PRN

## 2021-04-29 MED ORDER — PRENATAL MULTIVITAMIN CH
1.0000 | ORAL_TABLET | Freq: Every day | ORAL | Status: DC
  Administered 2021-04-30: 1 via ORAL
  Filled 2021-04-29: qty 1

## 2021-04-29 MED ORDER — DIPHENHYDRAMINE HCL 50 MG/ML IJ SOLN: 12.5000 mg | INTRAMUSCULAR | Status: DC | PRN

## 2021-04-29 MED ORDER — FENTANYL-BUPIVACAINE-NACL 0.5-0.125-0.9 MG/250ML-% EP SOLN
12.0000 mL/h | EPIDURAL | Status: DC | PRN
  Administered 2021-04-29: 12 mL/h via EPIDURAL
  Filled 2021-04-29: qty 250

## 2021-04-29 MED ORDER — PHENYLEPHRINE 40 MCG/ML (10ML) SYRINGE FOR IV PUSH (FOR BLOOD PRESSURE SUPPORT)
80.0000 ug | PREFILLED_SYRINGE | INTRAVENOUS | Status: DC | PRN
  Filled 2021-04-29: qty 10

## 2021-04-29 MED ORDER — LIDOCAINE HCL (PF) 1 % IJ SOLN
INTRAMUSCULAR | Status: DC | PRN
  Administered 2021-04-29: 10 mL via EPIDURAL

## 2021-04-29 MED ORDER — DOCUSATE SODIUM 100 MG PO CAPS
100.0000 mg | ORAL_CAPSULE | Freq: Two times a day (BID) | ORAL | Status: DC
  Administered 2021-04-30: 100 mg via ORAL
  Filled 2021-04-29: qty 1

## 2021-04-29 MED ORDER — SENNOSIDES-DOCUSATE SODIUM 8.6-50 MG PO TABS
2.0000 | ORAL_TABLET | Freq: Every day | ORAL | Status: DC
  Administered 2021-04-30: 2 via ORAL
  Filled 2021-04-29: qty 2

## 2021-04-29 MED ORDER — IBUPROFEN 600 MG PO TABS
600.0000 mg | ORAL_TABLET | Freq: Four times a day (QID) | ORAL | Status: DC
  Administered 2021-04-29 – 2021-04-30 (×4): 600 mg via ORAL
  Filled 2021-04-29 (×4): qty 1

## 2021-04-29 MED ORDER — PHENYLEPHRINE 40 MCG/ML (10ML) SYRINGE FOR IV PUSH (FOR BLOOD PRESSURE SUPPORT): 80.0000 ug | PREFILLED_SYRINGE | INTRAVENOUS | Status: DC | PRN

## 2021-04-29 MED ORDER — SIMETHICONE 80 MG PO CHEW: 80.0000 mg | CHEWABLE_TABLET | ORAL | Status: DC | PRN

## 2021-04-29 MED ORDER — LACTATED RINGERS IV SOLN
500.0000 mL | Freq: Once | INTRAVENOUS | Status: AC
  Administered 2021-04-29: 500 mL via INTRAVENOUS

## 2021-04-29 MED ORDER — WITCH HAZEL-GLYCERIN EX PADS: 1.0000 "application " | MEDICATED_PAD | CUTANEOUS | Status: DC | PRN

## 2021-04-29 MED ORDER — DIPHENHYDRAMINE HCL 25 MG PO CAPS: 25.0000 mg | ORAL_CAPSULE | Freq: Four times a day (QID) | ORAL | Status: DC | PRN

## 2021-04-29 MED ORDER — COCONUT OIL OIL: 1.0000 "application " | TOPICAL_OIL | Status: DC | PRN

## 2021-04-29 MED ORDER — TETANUS-DIPHTH-ACELL PERTUSSIS 5-2.5-18.5 LF-MCG/0.5 IM SUSY: 0.5000 mL | PREFILLED_SYRINGE | Freq: Once | INTRAMUSCULAR | Status: DC

## 2021-04-29 MED ORDER — DIBUCAINE (PERIANAL) 1 % EX OINT: 1.0000 "application " | TOPICAL_OINTMENT | CUTANEOUS | Status: DC | PRN

## 2021-04-29 MED ORDER — ONDANSETRON HCL 4 MG PO TABS: 4.0000 mg | ORAL_TABLET | ORAL | Status: DC | PRN

## 2021-04-29 MED ORDER — OXYCODONE HCL 5 MG PO TABS: 5.0000 mg | ORAL_TABLET | ORAL | Status: DC | PRN

## 2021-04-29 NOTE — Anesthesia Procedure Notes (Signed)
Epidural Patient location during procedure: OB Start time: 04/29/2021 8:00 AM End time: 04/29/2021 8:12 AM  Staffing Anesthesiologist: Lucretia Kern, MD Performed: anesthesiologist   Preanesthetic Checklist Completed: patient identified, IV checked, risks and benefits discussed, monitors and equipment checked, pre-op evaluation and timeout performed  Epidural Patient position: sitting Prep: DuraPrep Patient monitoring: heart rate, continuous pulse ox and blood pressure Approach: midline Location: L3-L4 Injection technique: LOR air  Needle:  Needle type: Tuohy  Needle gauge: 17 G Needle length: 9 cm Needle insertion depth: 6 cm Catheter type: closed end flexible Catheter size: 19 Gauge Catheter at skin depth: 11 cm Test dose: negative  Assessment Events: blood not aspirated, injection not painful, no injection resistance, no paresthesia and negative IV test  Additional Notes Reason for block:procedure for pain

## 2021-04-29 NOTE — Anesthesia Postprocedure Evaluation (Signed)
Anesthesia Post Note  Patient: Stacey Anderson  Procedure(s) Performed: AN AD HOC LABOR EPIDURAL     Patient location during evaluation: Mother Baby Anesthesia Type: Epidural Level of consciousness: awake and alert Pain management: pain level controlled Vital Signs Assessment: post-procedure vital signs reviewed and stable Respiratory status: spontaneous breathing, nonlabored ventilation and respiratory function stable Cardiovascular status: stable Postop Assessment: no headache, no backache and epidural receding Anesthetic complications: no   No notable events documented.  Last Vitals:  Vitals:   04/29/21 1230 04/29/21 1625  BP: 125/78 138/87  Pulse: 86 100  Resp: 18 18  Temp: 37 C 37.3 C  SpO2:      Last Pain:  Vitals:   04/29/21 1701  TempSrc:   PainSc: 0-No pain   Pain Goal: Patients Stated Pain Goal: 0 (04/29/21 0842)                 Emmaline Kluver N

## 2021-04-29 NOTE — Progress Notes (Signed)
Patient initially comfortable s/p epidural however now with more painful contractions and rectal pressure. CE now 9/+1. Anticipate SVD. Early decels, TOCO q2-93m BP 131/76   Pulse 70   Temp 98.9 F (37.2 C) (Oral)   Resp 17   Ht 5' 1.5" (1.562 m)   Wt 89.9 kg   LMP 07/30/2020   SpO2 98%   BMI 36.84 kg/m

## 2021-04-29 NOTE — Anesthesia Preprocedure Evaluation (Signed)
Anesthesia Evaluation  Patient identified by MRN, date of birth, ID band Patient awake    Reviewed: Allergy & Precautions, H&P , NPO status , Patient's Chart, lab work & pertinent test results  History of Anesthesia Complications Negative for: history of anesthetic complications  Airway Mallampati: II  TM Distance: >3 FB     Dental   Pulmonary neg pulmonary ROS, former smoker,    Pulmonary exam normal        Cardiovascular hypertension (gestational),  Rhythm:regular Rate:Normal     Neuro/Psych negative neurological ROS  negative psych ROS   GI/Hepatic negative GI ROS, Neg liver ROS,   Endo/Other  negative endocrine ROS  Renal/GU negative Renal ROS  negative genitourinary   Musculoskeletal   Abdominal   Peds  Hematology negative hematology ROS (+)   Anesthesia Other Findings   Reproductive/Obstetrics (+) Pregnancy                             Anesthesia Physical Anesthesia Plan  ASA: 2  Anesthesia Plan: Epidural   Post-op Pain Management:    Induction:   PONV Risk Score and Plan:   Airway Management Planned:   Additional Equipment:   Intra-op Plan:   Post-operative Plan:   Informed Consent: I have reviewed the patients History and Physical, chart, labs and discussed the procedure including the risks, benefits and alternatives for the proposed anesthesia with the patient or authorized representative who has indicated his/her understanding and acceptance.       Plan Discussed with:   Anesthesia Plan Comments:         Anesthesia Quick Evaluation

## 2021-04-30 LAB — CBC
HCT: 28.7 % — ABNORMAL LOW (ref 36.0–46.0)
Hemoglobin: 9.2 g/dL — ABNORMAL LOW (ref 12.0–15.0)
MCH: 28.4 pg (ref 26.0–34.0)
MCHC: 32.1 g/dL (ref 30.0–36.0)
MCV: 88.6 fL (ref 80.0–100.0)
Platelets: 202 10*3/uL (ref 150–400)
RBC: 3.24 MIL/uL — ABNORMAL LOW (ref 3.87–5.11)
RDW: 14.2 % (ref 11.5–15.5)
WBC: 15 10*3/uL — ABNORMAL HIGH (ref 4.0–10.5)
nRBC: 0 % (ref 0.0–0.2)

## 2021-04-30 NOTE — Discharge Summary (Signed)
Postpartum Discharge Summary  Date of Service updated      Patient Name: Stacey Anderson DOB: 1996/02/20 MRN: [id number]  Date of admission: 04/28/2021 Delivery date:04/29/2021  Delivering provider: Deliah Boston  Date of discharge: 04/30/2021  Admitting diagnosis: Gestational hypertension [O13.9] Intrauterine pregnancy: [redacted]w[redacted]d    Secondary diagnosis:  Principal Problem:   Gestational hypertension  Additional problems: Gestational hypertension    Discharge diagnosis: Term Pregnancy Delivered and Gestational Hypertension                                              Post partum procedures: none Augmentation: Pitocin Complications: None  Hospital course: Induction of Labor With Vaginal Delivery   25y.o. yo G1P1001 at 325w1das admitted to the hospital 04/28/2021 for induction of labor.  Indication for induction: Gestational hypertension.  Patient had an uncomplicated labor course as follows: Membrane Rupture Time/Date: 5:40 AM ,04/29/2021   Delivery Method:Vaginal, Spontaneous  Episiotomy: None  Lacerations:  Labial;Perineal  Details of delivery can be found in separate delivery note.  Patient had a routine postpartum course. Patient is discharged home 04/30/21.  Newborn Data: Birth date:04/29/2021  Birth time:9:15 AM  Gender:Female  Living status:Living  Apgars:8 ,9  Weight:3220 g   Magnesium Sulfate received: No BMZ received: No Rhophylac:No MMR:No T-DaP:Given prenatally Flu: No Transfusion:No  Physical exam  Vitals:   04/29/21 1625 04/29/21 2040 04/30/21 0100 04/30/21 0512  BP: 138/87 126/77 123/85 122/71  Pulse: 100 86 88 76  Resp: _0 Temp: 99.1 F (37.3 C) 98.8 F (37.1 C)  98.6 F (37 C)  TempSrc: Oral Oral Oral Oral  SpO2:  97% 96% 98%  Weight:      Height:       Labs: Lab Results  Component Value Date   WBC 15.0 (H) 04/30/2021   HGB 9.2 (L) 04/30/2021   HCT 28.7 (L) 04/30/2021   MCV 88.6 04/30/2021   PLT 202 04/30/2021   CMP  Latest Ref Rng & Units 04/28/2021  Glucose 70 - 99 mg/dL 86  BUN 6 - 20 mg/dL <5(L)  Creatinine 0.44 - 1.00 mg/dL 0.59  Sodium 135 - 145 mmol/L 137  Potassium 3.5 - 5.1 mmol/L 3.8  Chloride 98 - 111 mmol/L 106  CO2 22 - 32 mmol/L 19(L)  Calcium 8.9 - 10.3 mg/dL 9.2  Total Protein 6.5 - 8.1 g/dL 5.6(L)  Total Bilirubin 0.3 - 1.2 mg/dL 0.4  Alkaline Phos 38 - 126 U/L 158(H)  AST 15 - 41 U/L 16  ALT 0 - 44 U/L 11   Edinburgh Score: Edinburgh Postnatal Depression Scale Screening Tool 04/29/2021  I have been able to laugh and see the funny side of things. (No Data)      After visit meds:  Allergies as of 04/30/2021   No Known Allergies      Medication List     TAKE these medications    multivitamin-prenatal 27-0.8 MG Tabs tablet Take 1 tablet by mouth daily at 12 noon.         Discharge home in stable condition Infant Feeding:  ? Infant Disposition:home with mother Discharge instruction: per After Visit Summary and Postpartum booklet. Activity: Advance as tolerated. Pelvic rest for 6 weeks.  Diet: low salt diet Anticipated Birth Control: Unsure Postpartum Appointment:4 weeks Additional Postpartum F/U: BP check 2 weeks Future  Appointments:No future appointments. Follow up Visit:  Follow-up Information     [facility], [facility] Follow up in 2 week(s).   Contact information: [address] [place] [address] [phone]                     04/30/2021 Daria Pastures, MD

## 2021-04-30 NOTE — Progress Notes (Signed)
Patient is eating, ambulating, voiding.  Pain control is good.  Vitals:   04/29/21 1625 04/29/21 2040 04/30/21 0100 04/30/21 0512  BP: 138/87 126/77 123/85 122/71  Pulse: 100 86 88 76  Resp: 18 18 18 18   Temp: 99.1 F (37.3 C) 98.8 F (37.1 C)  98.6 F (37 C)  TempSrc: Oral Oral Oral Oral  SpO2:  97% 96% 98%  Weight:      Height:        Fundus firm Perineum without swelling.  Lab Results  Component Value Date   WBC 15.0 (H) 04/30/2021   HGB 9.2 (L) 04/30/2021   HCT 28.7 (L) 04/30/2021   MCV 88.6 04/30/2021   PLT 202 04/30/2021    --/--/A POS (12/07 1815)/RI  A/P Post partum day 1.  Routine care.  Expect d/c routine if BPs stay stable.  Iron.  10-06-2005

## 2021-05-13 ENCOUNTER — Telehealth (HOSPITAL_COMMUNITY): Payer: Self-pay | Admitting: *Deleted

## 2021-05-13 NOTE — Telephone Encounter (Signed)
Attempted hospital discharge follow-up call. Left message for patient to return RN call. Deforest Hoyles, RN, 05/13/21, [phone]

## 2022-11-23 IMAGING — US US MFM OB COMP +14 WKS
1 series · 14 of 28 positions shown · non-contrast
Comparison: none

[Series 1: us mfm ob comp +14 wks · 14 of 84 slices shown]
[im 4/84]
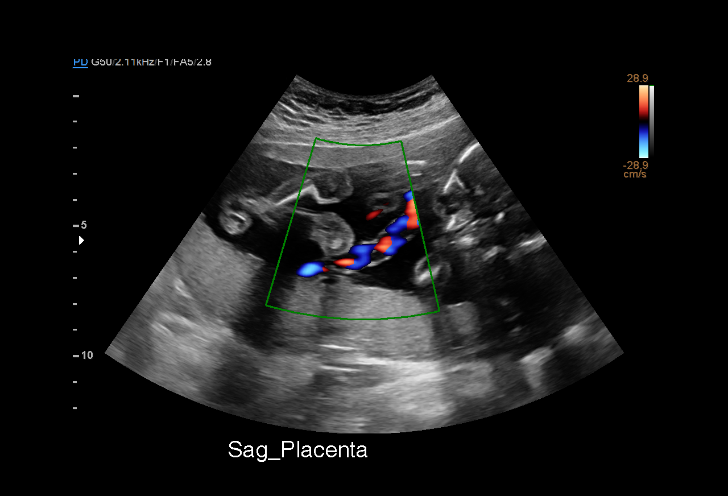
[im 10/84]
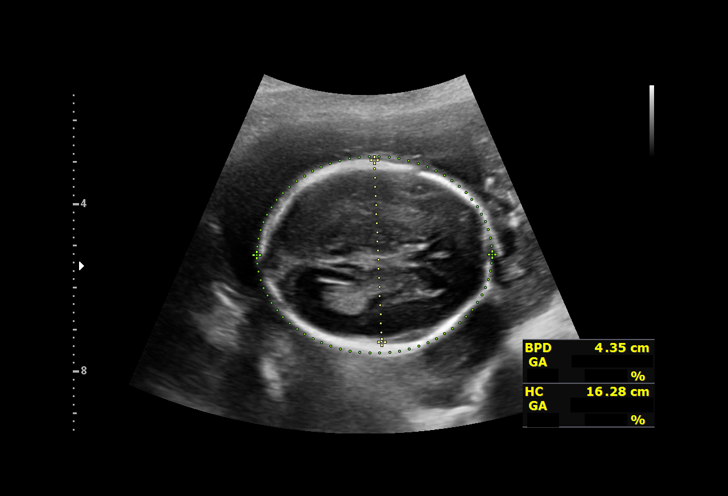
[im 16/84]
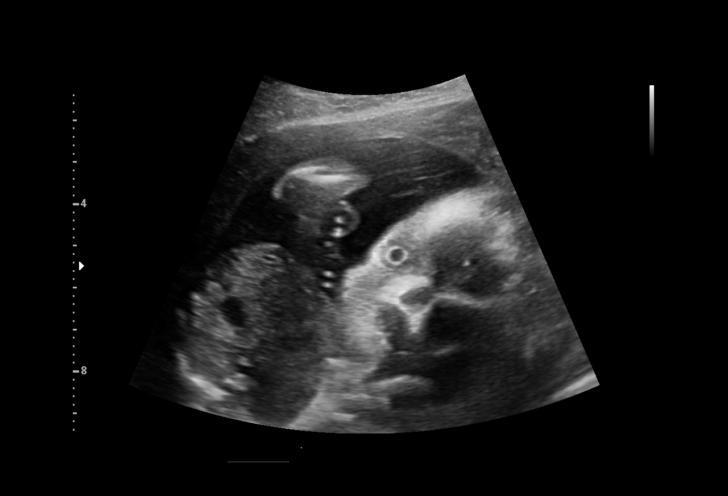
[im 22/84]
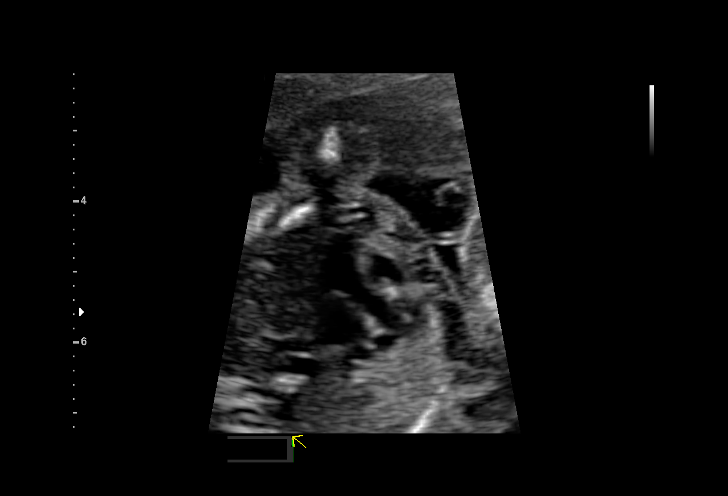
[im 28/84]
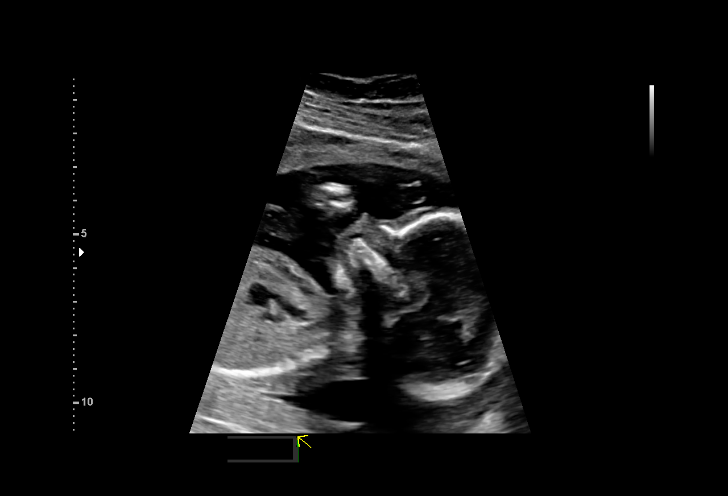
[im 34/84]
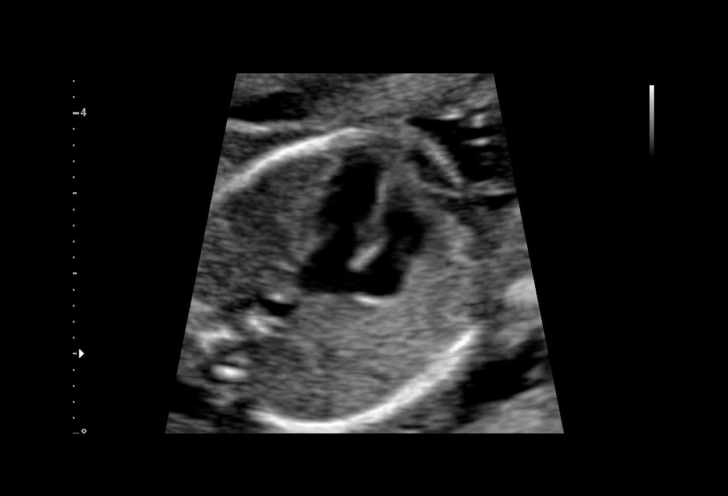
[im 40/84]
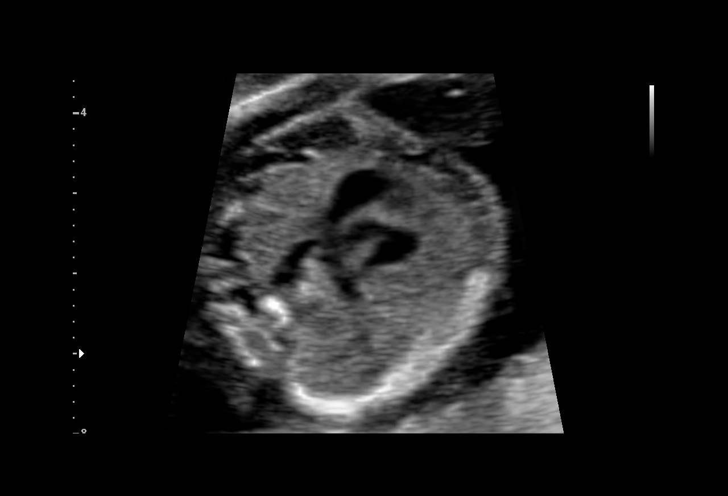
[im 47/84]
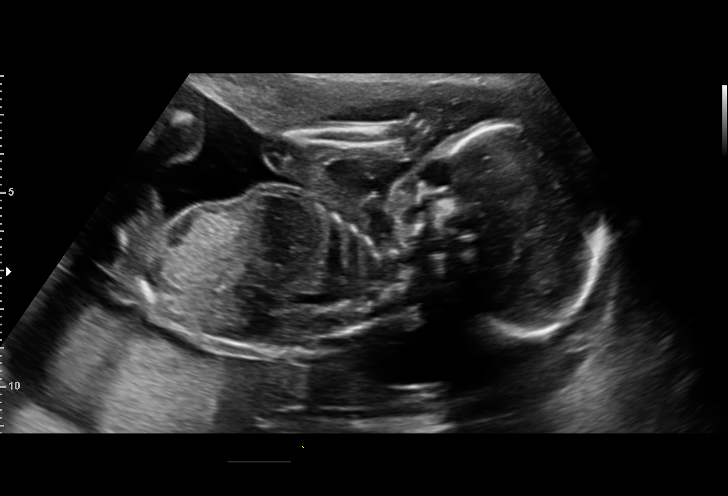
[im 53/84]
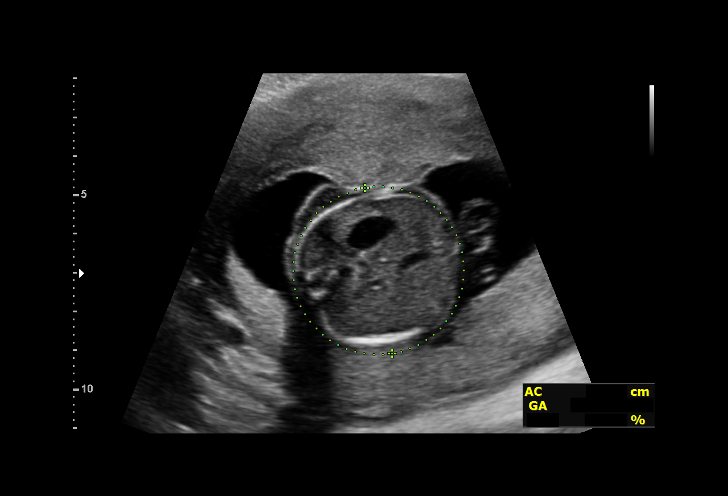
[im 59/84]
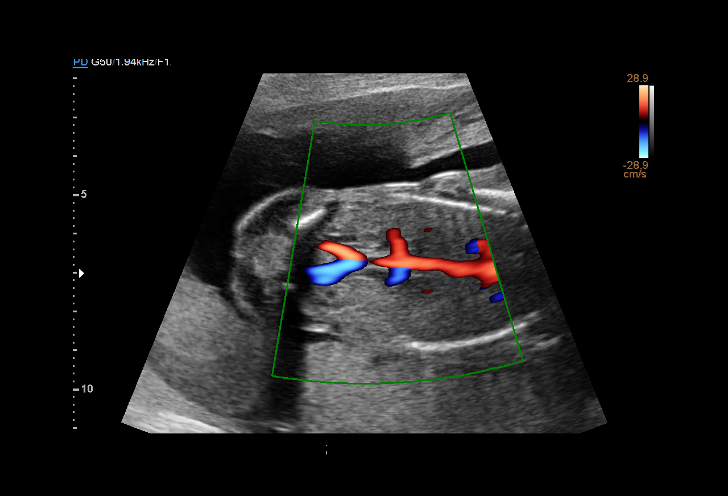
[im 65/84]
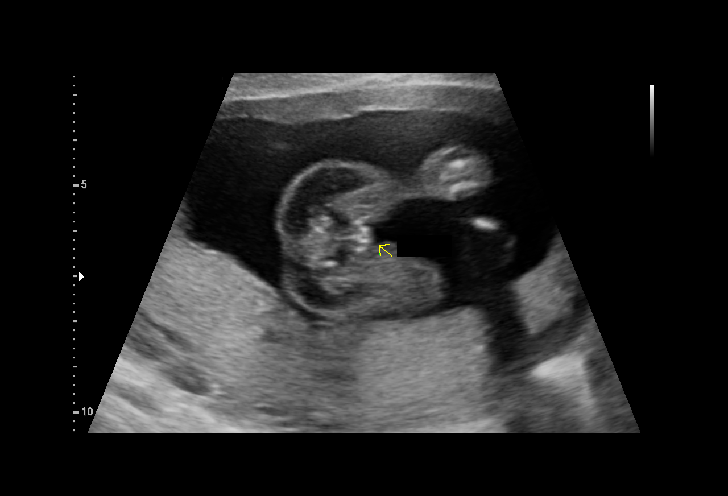
[im 71/84]
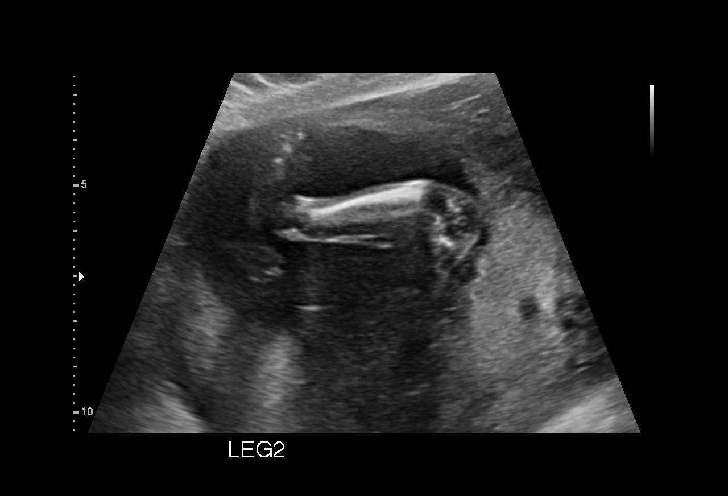
[im 77/84]
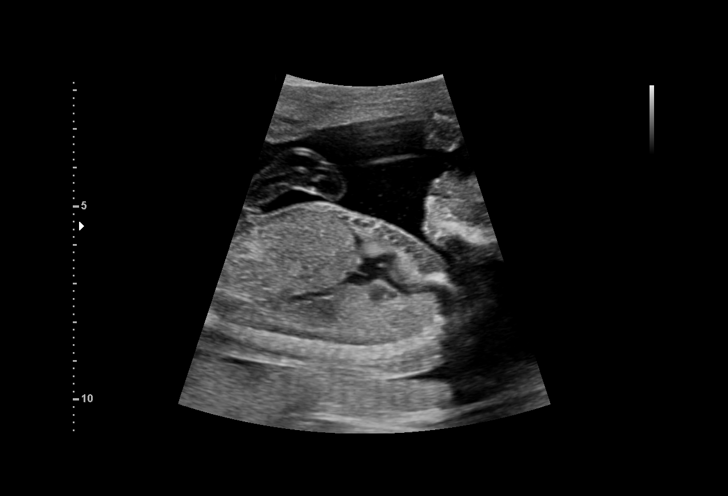
[im 84/84]
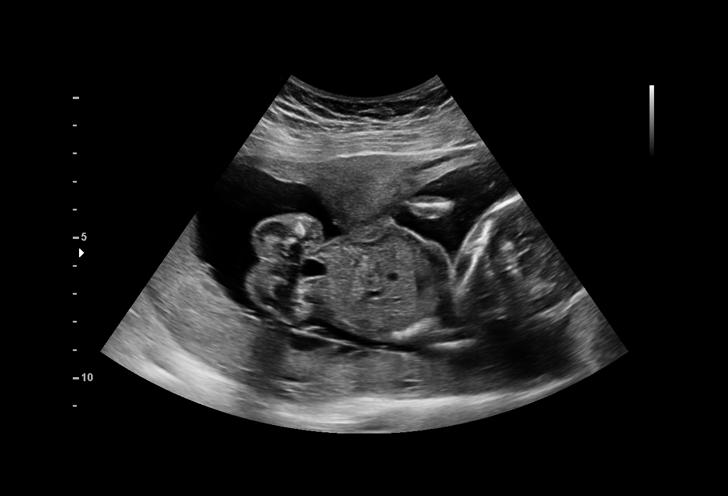

[14 of 28 positions shown; findings below may reference images not displayed]

Indications

 19 weeks gestation of pregnancy
 Encounter for antenatal screening for
 malformations
 LR NIPS, Neg Horizon, Neg AFP
Fetal Evaluation

 Num Of Fetuses:         1
 Fetal Heart Rate(bpm):  143
 Cardiac Activity:       Observed
 Presentation:           Cephalic
 Placenta:               Posterior
 P. Cord Insertion:      Visualized, central

 Amniotic Fluid
 AFI FV:      Within normal limits

                             Largest Pocket(cm)

Biometry

 BPD:      43.1  mm     G. Age:  19w 0d         52  %    CI:        70.39   %    70 - 86
                                                         FL/HC:      18.1   %    16.1 -
 HC:      163.8  mm     G. Age:  19w 1d         49  %    HC/AC:      1.18        1.09 -
 AC:      138.6  mm     G. Age:  19w 2d         55  %    FL/BPD:     68.9   %
 FL:       29.7  mm     G. Age:  19w 1d         49  %    FL/AC:      21.4   %    20 - 24
 HUM:      30.3  mm     G. Age:  20w 0d         76  %
 CER:      20.6  mm     G. Age:  19w 5d         70  %
 NFT:       5.0  mm

 LV:        5.5  mm
 CM:          4  mm

 Est. FW:     280  gm    0 lb 10 oz      58  %
OB History

 Gravidity:    1
 Living:       0
Gestational Age

 LMP:           19w 6d        Date:  07/30/20                 EDD:   05/06/21
 U/S Today:     19w 1d                                        EDD:   05/11/21
 Best:          19w 0d     Det. By:  Early Ultrasound         EDD:   05/12/21
                                     (09/23/20)
Anatomy

 Cranium:               Appears normal         Aortic Arch:            Appears normal
 Cavum:                 Appears normal         Ductal Arch:            Appears normal
 Ventricles:            Appears normal         Diaphragm:              Appears normal
 Choroid Plexus:        Appears normal         Stomach:                Appears normal, left
                                                                       sided
 Cerebellum:            Appears normal         Abdomen:                Appears normal
 Posterior Fossa:       Appears normal         Abdominal Wall:         Appears nml (cord
                                                                       insert, abd wall)
 Nuchal Fold:           Appears normal         Cord Vessels:           Appears normal (3
                                                                       vessel cord)
 Face:                  Appears normal         Kidneys:                Appear normal
                        (orbits and profile)
 Lips:                  Appears normal         Bladder:                Appears normal
 Thoracic:              Appears normal         Spine:                  Appears normal
 Heart:                 Appears normal         Upper Extremities:      Appears normal
                        (4CH, axis, and
                        situs)
 RVOT:                  Appears normal         Lower Extremities:      Appears normal
 LVOT:                  Appears normal

 Other:  Fetus appears to be female. Heels and 5th digit visualized. VC, 3VV
         and 3VTV visualized.
Cervix Uterus Adnexa

 Cervix
 Length:           4.31  cm.
 Normal appearance by transabdominal scan.

 Right Ovary
 Visualized.

 Left Ovary
 Visualized.
Impression

 Single intrauterine pregnancy here for a complete anatomy,
 she has a low risk NIPS and Neg Horizon
 Normal anatomy with measurements consistent with dates
 There is good fetal movement and amniotic fluid volume
Recommendations

 Follow up as clinically indicated.

## 2023-02-07 ENCOUNTER — Emergency Department (HOSPITAL_BASED_OUTPATIENT_CLINIC_OR_DEPARTMENT_OTHER)
Admission: EM | Admit: 2023-02-07 | Discharge: 2023-02-07 | Disposition: A | Discharge status: Home / Self Care | Payer: BC Managed Care – PPO | Attending: Emergency Medicine | Admitting: Emergency Medicine

## 2023-02-07 ENCOUNTER — Emergency Department (HOSPITAL_BASED_OUTPATIENT_CLINIC_OR_DEPARTMENT_OTHER): Payer: BC Managed Care – PPO

## 2023-02-07 ENCOUNTER — Other Ambulatory Visit: Payer: Self-pay

## 2023-02-07 ENCOUNTER — Encounter (HOSPITAL_BASED_OUTPATIENT_CLINIC_OR_DEPARTMENT_OTHER): Payer: Self-pay | Admitting: Pediatrics

## 2023-02-07 ENCOUNTER — Other Ambulatory Visit (HOSPITAL_BASED_OUTPATIENT_CLINIC_OR_DEPARTMENT_OTHER): Payer: Self-pay

## 2023-02-07 DIAGNOSIS — R1084 Generalized abdominal pain: Secondary | ICD-10-CM

## 2023-02-07 DIAGNOSIS — R000 Tachycardia, unspecified: Secondary | ICD-10-CM | POA: Diagnosis not present

## 2023-02-07 DIAGNOSIS — R002 Palpitations: Secondary | ICD-10-CM | POA: Diagnosis not present

## 2023-02-07 DIAGNOSIS — K529 Noninfective gastroenteritis and colitis, unspecified: Secondary | ICD-10-CM | POA: Diagnosis not present

## 2023-02-07 DIAGNOSIS — R112 Nausea with vomiting, unspecified: Secondary | ICD-10-CM | POA: Diagnosis present

## 2023-02-07 DIAGNOSIS — D72829 Elevated white blood cell count, unspecified: Secondary | ICD-10-CM | POA: Diagnosis not present

## 2023-02-07 LAB — CBC
HCT: 43.6 % (ref 36.0–46.0)
Hemoglobin: 14.4 g/dL (ref 12.0–15.0)
MCH: 29.9 pg (ref 26.0–34.0)
MCHC: 33 g/dL (ref 30.0–36.0)
MCV: 90.6 fL (ref 80.0–100.0)
Platelets: 290 10*3/uL (ref 150–400)
RBC: 4.81 MIL/uL (ref 3.87–5.11)
RDW: 12.6 % (ref 11.5–15.5)
WBC: 12.8 10*3/uL — ABNORMAL HIGH (ref 4.0–10.5)
nRBC: 0 % (ref 0.0–0.2)

## 2023-02-07 LAB — HEPATIC FUNCTION PANEL
ALT: 10 U/L (ref 0–44)
AST: 13 U/L — ABNORMAL LOW (ref 15–41)
Albumin: 4.1 g/dL (ref 3.5–5.0)
Alkaline Phosphatase: 53 U/L (ref 38–126)
Bilirubin, Direct: 0.2 mg/dL (ref 0.0–0.2)
Indirect Bilirubin: 0.7 mg/dL (ref 0.3–0.9)
Total Bilirubin: 0.9 mg/dL (ref 0.3–1.2)
Total Protein: 7.5 g/dL (ref 6.5–8.1)

## 2023-02-07 LAB — BASIC METABOLIC PANEL
Anion gap: 7 (ref 5–15)
BUN: 11 mg/dL (ref 6–20)
CO2: 20 mmol/L — ABNORMAL LOW (ref 22–32)
Calcium: 8.3 mg/dL — ABNORMAL LOW (ref 8.9–10.3)
Chloride: 110 mmol/L (ref 98–111)
Creatinine, Ser: 0.87 mg/dL (ref 0.44–1.00)
GFR, Estimated: >60 mL/min (ref >60)
Glucose, Bld: 103 mg/dL — ABNORMAL HIGH (ref 70–99)
Potassium: 3.5 mmol/L (ref 3.5–5.1)
Sodium: 137 mmol/L (ref 135–145)

## 2023-02-07 LAB — LIPASE, BLOOD: Lipase: 26 U/L (ref 11–51)

## 2023-02-07 LAB — PREGNANCY, URINE: Preg Test, Ur: NEGATIVE

## 2023-02-07 MED ORDER — ONDANSETRON HCL 4 MG/2ML IJ SOLN
4.0000 mg | Freq: Once | INTRAMUSCULAR | Status: AC
  Administered 2023-02-07: 4 mg via INTRAVENOUS
  Filled 2023-02-07: qty 2

## 2023-02-07 MED ORDER — IOHEXOL 300 MG/ML  SOLN
100.0000 mL | Freq: Once | INTRAMUSCULAR | Status: AC | PRN
  Administered 2023-02-07: 100 mL via INTRAVENOUS

## 2023-02-07 MED ORDER — SODIUM CHLORIDE 0.9 % IV BOLUS
1000.0000 mL | Freq: Once | INTRAVENOUS | Status: AC
Start: 2023-02-07 — End: 2023-02-07
  Administered 2023-02-07: 1000 mL via INTRAVENOUS

## 2023-02-07 MED ORDER — LOPERAMIDE HCL 2 MG PO TABS
2.0000 mg | ORAL_TABLET | Freq: Four times a day (QID) | ORAL | 0 refills | Status: AC | PRN
  Filled 2023-02-07: qty 24, 6d supply, fill #0

## 2023-02-07 MED ORDER — ONDANSETRON 4 MG PO TBDP
4.0000 mg | ORAL_TABLET | Freq: Three times a day (TID) | ORAL | 1 refills | Status: AC | PRN
  Filled 2023-02-07: qty 12, 4d supply, fill #0

## 2023-02-07 MED ORDER — SODIUM CHLORIDE 0.9 % IV SOLN: INTRAVENOUS | Status: DC

## 2023-02-07 NOTE — ED Notes (Signed)
Patient transported to CT 

## 2023-02-07 NOTE — ED Provider Notes (Addendum)
[facility] Provider Note   CSN: [id number] Arrival date & time: 02/07/23  0801     History  Chief Complaint  Patient presents with   Tachycardia    Stacey Anderson is a 27 y.o. female.  Patient with onset of nausea vomiting and diarrhea yesterday.  This continued through today.  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  Associated with abdominal cramping generalized.  Her smart watch is says that her heart rate is going fast at times throughout the night up to 140.  Patient with no known history of heart palpitations or arrhythmia.  Cardiac monitoring here initially shows sinus rhythm and EKG is consistent with sinus rhythm.  Will continue to monitor.  Past medical history noncontributory.  Patient a former smoker of cigarettes.  Patient denies any blood in the vomit.  In addition there is been some chest discomfort.  But has not been persistent.       Home Medications Prior to Admission medications   Medication Sig Start Date End Date Taking? Authorizing Provider  Prenatal Vit-Fe Fumarate-FA (MULTIVITAMIN-PRENATAL) 27-0.8 MG TABS tablet Take 1 tablet by mouth daily at 12 noon.    [provider]      Allergies    Patient has no known allergies.    Review of Systems   Review of Systems  Constitutional:  Negative for chills and fever.  HENT:  Negative for ear pain and sore throat.   Eyes:  Negative for pain and visual disturbance.  Respiratory:  Negative for cough and shortness of breath.   Cardiovascular:  Positive for palpitations. Negative for chest pain.  Gastrointestinal:  Positive for abdominal pain, diarrhea, nausea and vomiting.  Genitourinary:  Negative for dysuria and hematuria.  Musculoskeletal:  Negative for arthralgias and back pain.  Skin:  Negative for color change and rash.  Neurological:  Negative for seizures and syncope.  All other systems reviewed and are  negative.   Physical Exam Updated Vital Signs BP (!) 134/92 (BP Location: Left Arm)   Pulse 72   Temp 97.8 F (36.6 C) (Oral)   Resp 17   Ht 1.562 m (5' 1.5")   Wt 63.5 kg   LMP 01/24/2023 (Approximate)   SpO2 100%   BMI 26.02 kg/m  Physical Exam Vitals and nursing note reviewed.  Constitutional:      General: She is not in acute distress.    Appearance: She is well-developed. She is obese.  HENT:     Head: Normocephalic and atraumatic.     Mouth/Throat:     Mouth: Mucous membranes are moist.  Eyes:     Extraocular Movements: Extraocular movements intact.     Conjunctiva/sclera: Conjunctivae normal.     Pupils: Pupils are equal, round, and reactive to light.  Cardiovascular:     Rate and Rhythm: Normal rate and regular rhythm.     Heart sounds: No murmur heard. Pulmonary:     Effort: Pulmonary effort is normal. No respiratory distress.     Breath sounds: Normal breath sounds.  Abdominal:     General: There is no distension.     Palpations: Abdomen is soft.     Tenderness: There is no abdominal tenderness. There is no guarding.  Musculoskeletal:        General: No swelling.     Cervical back: Normal range of motion and neck supple.  Skin:    General: Skin is warm and dry.  Capillary Refill: Capillary refill takes less than 2 seconds.  Neurological:     General: No focal deficit present.     Mental Status: She is alert.  Psychiatric:        Mood and Affect: Mood normal.     ED Results / Procedures / Treatments   Labs (all labs ordered are listed, but only abnormal results are displayed) Labs Reviewed  CBC - Abnormal; Notable for the following components:      Result Value   WBC 12.8 (*)    All other components within normal limits  BASIC METABOLIC PANEL  PREGNANCY, URINE  LIPASE, BLOOD  HEPATIC FUNCTION PANEL    EKG EKG Interpretation Date/Time:  Tuesday February 07 2023 08:14:47 EDT Ventricular Rate:  68 PR Interval:  145 QRS  Duration:  97 QT Interval:  379 QTC Calculation: 403 R Axis:   70  Text Interpretation: Sinus rhythm Borderline T wave abnormalities No previous ECGs available Confirmed by Vanetta Mulders [id number]) on 02/07/2023 8:17:58 AM  Radiology No results found.  Procedures Procedures    Medications Ordered in ED Medications  sodium chloride 0.9 % bolus 1,000 mL (has no administration in time range)  0.9 %  sodium chloride infusion (has no administration in time range)  ondansetron (ZOFRAN) injection 4 mg (has no administration in time range)    ED Course/ Medical Decision Making/ A&P                                 Medical Decision Making Amount and/or Complexity of Data Reviewed Labs: ordered. Radiology: ordered.  Risk Prescription drug management.   Will continue to do cardiac monitoring here.  Initially it is all been sinus rhythm.  Oxygen saturations been 100%.  Patient does seem to have a gastroenteritis symptomatology.  Still ongoing.  Associated with abdominal cramping.  Will get CT scan to rule out any acute process.  CBC white blood cell count 12.8 hemoglobin 14.4 platelets 290.  Basic metabolic panel hepatic and lipase are pending as well as urinalysis and pregnancy test.  Patient's basic metabolic panel without any significant abnormalities.  Pregnancy test negative lipase normal hepatic function test normal.  CT scan of the abdomen and pelvis did show some mild mucosal hyperenhancement of the retrosigmoid colon which can be seen in the setting infectious or inflammatory colitis.  Still think that this is probably a viral gastroenteritis.  Will avoid treating with antibiotics for right now patient does not appear toxic.  Although does have a mild leukocytosis.  Will treat with Imodium and Zofran.  Precautions provided if symptoms do not improve that she may need to follow-up perhaps for inflammatory or infectious bowel disease.  Regarding the palpitations which which may  be related to this illness.  Patient's had no palpitations here or any arrhythmias here.  But patient given referral to cardiology in case she needs monitoring at home.  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     Final Clinical Impression(s) / ED Diagnoses Final diagnoses:  Palpitations  Generalized abdominal pain  Gastroenteritis    Rx / DC Orders ED Discharge Orders     None         Vanetta Mulders, MD 02/07/23 [id number]    Vanetta Mulders, MD 02/07/23 1320

## 2023-02-07 NOTE — Discharge Instructions (Addendum)
Make an appointment to follow-up with cardiology regarding the palpitations.  However they may be secondary to the current gastroenteritis.  Take the Imodium and Zofran as needed.  Would expect improvement over the next few days.  If things persist based on your CT scan there could be some evidence of inflammatory or infectious bowel disease.  As we discussed we can hold off on antibiotics for now.  Work note provided.

## 2023-02-07 NOTE — ED Triage Notes (Signed)
Reports on and off irregular HR according to a smart watch along with chest tightness and NV and some abdominal cramping.

## 2023-02-07 NOTE — ED Notes (Signed)
Lab notified to add Lipase and Hepatic function panel to current sample.

## 2023-02-07 NOTE — ED Notes (Signed)
D/c paperwork reviewed with pt, including prescriptions and follow up care.  All questions and/or concerns addressed at time of d/c.  No further needs expressed. . Pt verbalized understanding, Ambulatory with family to ED exit, NAD.
